# Patient Record
Sex: Male | Born: 1990 | Race: Black or African American | Hispanic: No | Marital: Married | Smoking: Never smoker
Health system: Southern US, Community
[De-identification: ages and names within clinical notes are randomized; demographics above are authoritative.]

---

## 2019-12-07 DIAGNOSIS — A084 Viral intestinal infection, unspecified: Secondary | ICD-10-CM | POA: Diagnosis not present

## 2019-12-11 DIAGNOSIS — A0839 Other viral enteritis: Secondary | ICD-10-CM | POA: Diagnosis not present

## 2019-12-13 DIAGNOSIS — A0839 Other viral enteritis: Secondary | ICD-10-CM | POA: Diagnosis not present

## 2020-06-24 DIAGNOSIS — H5789 Other specified disorders of eye and adnexa: Secondary | ICD-10-CM | POA: Diagnosis not present

## 2020-06-24 DIAGNOSIS — S0591XA Unspecified injury of right eye and orbit, initial encounter: Secondary | ICD-10-CM | POA: Diagnosis not present

## 2020-08-18 DIAGNOSIS — Z20822 Contact with and (suspected) exposure to covid-19: Secondary | ICD-10-CM | POA: Diagnosis not present

## 2021-10-02 NOTE — Progress Notes (Signed)
Subjective:    Spencer Wood - 30 y.o. male MRN [id number]  Date of birth: 1991/06/30  HPI  Spencer Wood is to establish care and annual physical exam.    Current issues and/or concerns: CHEST TIGHTNESS: Patient reports intermittent chest tightness with shortness of breath for the past 5 years. Denies chest pain, cough, coughing up blood, and additional red flag symptoms. Works in Adult nurse at [employer]. Sometimes job requires him to be in environment of porous products (wearing a respirator at that time). Not sure if symptoms are coming from work environment or history of allergies. Has not been seen by employee health or provider in the past for these concerns. Used Albuterol inhaler in the past and was helpful.  2. NASAL CONCERN: Reports left nasal passage intermittently stuffy and then pops open. History of allergies since childhood and taking over-the-counter allergy medications as needed. Has improved diet to hopefully decrease mucus production.    ROS per HPI    Health Maintenance:  Health Maintenance Due  Topic Date Due   Hepatitis C Screening  Never done     Past Medical History: There are no problems to display for this patient.   Social History   reports that he has never smoked. He has never used smokeless tobacco. He reports current alcohol use of about 1.0 standard drink per week. He reports that he does not use drugs.   Family History  family history includes Hypertension in his father.   Medications: reviewed and updated   Objective:   Physical Exam BP 107/75 (BP Location: Left Arm, Patient Position: Sitting, Cuff Size: Normal)   Pulse 63   Temp 98.6 F (37 C)   Resp 18   Ht 5' 1.18" (1.554 m)   Wt 148 lb 9.6 oz (67.4 kg)   SpO2 98%   BMI 27.91 kg/m   Physical Exam HENT:     Head: Normocephalic and atraumatic.     Right Ear: Tympanic membrane, ear canal and external ear normal.     Left Ear: Tympanic membrane, ear canal and external ear  normal.     Nose: Nose normal.     Left Turbinates: Swollen.     Mouth/Throat:     Mouth: Mucous membranes are moist.     Pharynx: Oropharynx is clear.  Eyes:     Extraocular Movements: Extraocular movements intact.     Conjunctiva/sclera: Conjunctivae normal.     Pupils: Pupils are equal, round, and reactive to light.  Cardiovascular:     Rate and Rhythm: Normal rate and regular rhythm.     Pulses: Normal pulses.     Heart sounds: Normal heart sounds.  Pulmonary:     Effort: Pulmonary effort is normal.     Breath sounds: Normal breath sounds.  Abdominal:     General: Bowel sounds are normal.     Palpations: Abdomen is soft.  Genitourinary:    Comments: Patient declined exam. Musculoskeletal:        General: Normal range of motion.     Cervical back: Normal range of motion and neck supple.  Skin:    General: Skin is warm and dry.     Capillary Refill: Capillary refill takes less than 2 seconds.  Neurological:     General: No focal deficit present.     Mental Status: He is alert and oriented to person, place, and time.  Psychiatric:        Mood and Affect: Mood normal.  Behavior: Behavior normal.       Assessment & Plan:  1. Encounter to establish care: 2. Annual physical exam: - Counseled on 150 minutes of exercise per week as tolerated, healthy eating (including decreased daily intake of saturated fats, cholesterol, added sugars, sodium), STI prevention, and routine healthcare maintenance.  3. Screening for metabolic disorder: - HOZ22+QMGN to check kidney function, liver function, and electrolyte balance.  - CMP14+EGFR  4. Screening for deficiency anemia: - CBC to screen for anemia. - CBC  5. Diabetes mellitus screening: - Hemoglobin A1c to screen for pre-diabetes/diabetes. - Hemoglobin A1c  6. Screening cholesterol level: - Lipid panel to screen for high cholesterol.  - Lipid panel  7. Thyroid disorder screen: - TSH to check thyroid function.  -  TSH  8. Need for hepatitis C screening test: - Hepatitis C antibody to screen for hepatitis C.  - Hepatitis C Antibody  9. Encounter for screening for HIV: - HIV antibody to screen for human immunodeficiency virus.  - HIV antibody (with reflex)  10. Chest tightness: 11. Shortness of breath: - Patient today in office without cardiopulmonary distress.  - Diagnostic chest x-ray for further evaluation. - Albuterol as prescribed.  - Referral to Pulmonology for further evaluation and management.  - Follow-up with primary provider as scheduled. - Ambulatory referral to Pulmonology - DG Chest 2 View; Future - albuterol (VENTOLIN HFA) 108 (90 Base) MCG/ACT inhaler; Inhale 2 puffs into the lungs every 6 (six) hours as needed for wheezing or shortness of breath.  Dispense: 18 g; Refill: 0  12. Perennial allergic rhinitis: - No evidence of deviated nasal septum. Symptomology sounds likely related to history of allergies.  - Referral to Allergy for further evaluation and management. - Follow-up with primary provider as needed.  - Ambulatory referral to Allergy  13. Screening for STD (sexually transmitted disease): - Urine cytology to screen for gonorrhea, chlamydia, and trichomonas.  - Urine cytology ancillary only    Patient was given clear instructions to go to Emergency Department or return to medical center if symptoms don't improve, worsen, or new problems develop.The patient verbalized understanding.  I discussed the assessment and treatment plan with the patient. The patient was provided an opportunity to ask questions and all were answered. The patient agreed with the plan and demonstrated an understanding of the instructions.   The patient was advised to call back or seek an in-person evaluation if the symptoms worsen or if the condition fails to improve as anticipated.    Durene Fruits, NP 10/05/2021, 8:58 AM Primary Care at [facility]

## 2021-10-05 ENCOUNTER — Encounter: Payer: Self-pay | Admitting: Family

## 2021-10-05 ENCOUNTER — Other Ambulatory Visit (HOSPITAL_COMMUNITY)
Admission: RE | Admit: 2021-10-05 | Discharge: 2021-10-05 | Disposition: A | Discharge status: Home / Self Care | Payer: BC Managed Care – PPO | Source: Ambulatory Visit | Attending: Family | Admitting: Family

## 2021-10-05 ENCOUNTER — Ambulatory Visit (INDEPENDENT_AMBULATORY_CARE_PROVIDER_SITE_OTHER): Payer: BC Managed Care – PPO | Admitting: Family

## 2021-10-05 ENCOUNTER — Encounter (INDEPENDENT_AMBULATORY_CARE_PROVIDER_SITE_OTHER): Payer: Self-pay

## 2021-10-05 ENCOUNTER — Ambulatory Visit (INDEPENDENT_AMBULATORY_CARE_PROVIDER_SITE_OTHER): Payer: BC Managed Care – PPO

## 2021-10-05 ENCOUNTER — Other Ambulatory Visit: Payer: Self-pay

## 2021-10-05 VITALS — BP 107/75 | HR 63 | Temp 98.6°F | Resp 18 | Ht 61.18 in | Wt 148.6 lb

## 2021-10-05 DIAGNOSIS — Z113 Encounter for screening for infections with a predominantly sexual mode of transmission: Secondary | ICD-10-CM | POA: Insufficient documentation

## 2021-10-05 DIAGNOSIS — R0789 Other chest pain: Secondary | ICD-10-CM | POA: Diagnosis not present

## 2021-10-05 DIAGNOSIS — Z7689 Persons encountering health services in other specified circumstances: Secondary | ICD-10-CM

## 2021-10-05 DIAGNOSIS — Z1329 Encounter for screening for other suspected endocrine disorder: Secondary | ICD-10-CM

## 2021-10-05 DIAGNOSIS — Z13228 Encounter for screening for other metabolic disorders: Secondary | ICD-10-CM | POA: Diagnosis not present

## 2021-10-05 DIAGNOSIS — Z0000 Encounter for general adult medical examination without abnormal findings: Secondary | ICD-10-CM

## 2021-10-05 DIAGNOSIS — Z131 Encounter for screening for diabetes mellitus: Secondary | ICD-10-CM | POA: Diagnosis not present

## 2021-10-05 DIAGNOSIS — R0602 Shortness of breath: Secondary | ICD-10-CM | POA: Diagnosis not present

## 2021-10-05 DIAGNOSIS — Z0001 Encounter for general adult medical examination with abnormal findings: Secondary | ICD-10-CM | POA: Diagnosis not present

## 2021-10-05 DIAGNOSIS — Z1159 Encounter for screening for other viral diseases: Secondary | ICD-10-CM

## 2021-10-05 DIAGNOSIS — J3089 Other allergic rhinitis: Secondary | ICD-10-CM | POA: Diagnosis not present

## 2021-10-05 DIAGNOSIS — Z13220 Encounter for screening for lipoid disorders: Secondary | ICD-10-CM

## 2021-10-05 DIAGNOSIS — Z114 Encounter for screening for human immunodeficiency virus [HIV]: Secondary | ICD-10-CM | POA: Diagnosis not present

## 2021-10-05 DIAGNOSIS — Z130 Encounter for screening for diseases of the blood and blood-forming organs and certain disorders involving the immune mechanism: Secondary | ICD-10-CM | POA: Diagnosis not present

## 2021-10-05 MED ORDER — ALBUTEROL SULFATE HFA 108 (90 BASE) MCG/ACT IN AERS
2.0000 | INHALATION_SPRAY | Freq: Four times a day (QID) | RESPIRATORY_TRACT | 0 refills | Status: DC | PRN
Start: 2021-10-05 — End: 2021-12-07

## 2021-10-05 NOTE — Patient Instructions (Signed)
Preventive Care 21-29 Years Old, Male ?Preventive care refers to lifestyle choices and visits with your health care provider that can promote health and wellness. Preventive care visits are also called wellness exams. ?What can I expect for my preventive care visit? ?Counseling ?During your preventive care visit, your health care provider may ask about your: ?Medical history, including: ?Past medical problems. ?Family medical history. ?Current health, including: ?Emotional well-being. ?Home life and relationship well-being. ?Sexual activity. ?Lifestyle, including: ?Alcohol, nicotine or tobacco, and drug use. ?Access to firearms. ?Diet, exercise, and sleep habits. ?Safety issues such as seatbelt and bike helmet use. ?Sunscreen use. ?Work and work environment. ?Physical exam ?Your health care provider may check your: ?Height and weight. These may be used to calculate your BMI (body mass index). BMI is a measurement that tells if you are at a healthy weight. ?Waist circumference. This measures the distance around your waistline. This measurement also tells if you are at a healthy weight and may help predict your risk of certain diseases, such as type 2 diabetes and high blood pressure. ?Heart rate and blood pressure. ?Body temperature. ?Skin for abnormal spots. ?What immunizations do I need? ?Vaccines are usually given at various ages, according to a schedule. Your health care provider will recommend vaccines for you based on your age, medical history, and lifestyle or other factors, such as travel or where you work. ?What tests do I need? ?Screening ?Your health care provider may recommend screening tests for certain conditions. This may include: ?Lipid and cholesterol levels. ?Diabetes screening. This is done by checking your blood sugar (glucose) after you have not eaten for a while (fasting). ?Hepatitis B test. ?Hepatitis C test. ?HIV (human immunodeficiency virus) test. ?STI (sexually transmitted infection)  testing, if you are at risk. ?Talk with your health care provider about your test results, treatment options, and if necessary, the need for more tests. ?Follow these instructions at home: ?Eating and drinking ? ?Eat a healthy diet that includes fresh fruits and vegetables, whole grains, lean protein, and low-fat dairy products. ?Drink enough fluid to keep your urine pale yellow. ?Take vitamin and mineral supplements as recommended by your health care provider. ?Do not drink alcohol if your health care provider tells you not to drink. ?If you drink alcohol: ?Limit how much you have to 0-2 drinks a day. ?Know how much alcohol is in your drink. In the [place] one drink equals one 12 oz bottle of beer (355 mL), one 5 oz glass of wine (148 mL), or one 1? oz glass of hard liquor (44 mL). ?Lifestyle ?Brush your teeth every morning and night with fluoride toothpaste. Floss one time each day. ?Exercise for at least 30 minutes 5 or more days each week. ?Do not use any products that contain nicotine or tobacco. These products include cigarettes, chewing tobacco, and vaping devices, such as e-cigarettes. If you need help quitting, ask your health care provider. ?Do not use drugs. ?If you are sexually active, practice safe sex. Use a condom or other form of protection to prevent STIs. ?Find healthy ways to manage stress, such as: ?Meditation, yoga, or listening to music. ?Journaling. ?Talking to a trusted person. ?Spending time with friends and family. ?Minimize exposure to UV radiation to reduce your risk of skin cancer. ?Safety ?Always wear your seat belt while driving or riding in a vehicle. ?Do not drive: ?If you have been drinking alcohol. Do not ride with someone who has been drinking. ?If you have been using any mind-altering substances or   drugs. ?While texting. ?When you are tired or distracted. ?Wear a helmet and other protective equipment during sports activities. ?If you have firearms in your house, make sure you  follow all gun safety procedures. ?Seek help if you have been physically or sexually abused. ?What's next? ?Go to your health care provider once a year for an annual wellness visit. ?Ask your health care provider how often you should have your eyes and teeth checked. ?Stay up to date on all vaccines. ?This information is not intended to replace advice given to you by your health care provider. Make sure you discuss any questions you have with your health care provider. ?Document Revised: 04/29/2021 Document Reviewed: 04/29/2021 ?Elsevier Patient Education ? 2022 Elsevier Inc. ? ?

## 2021-10-05 NOTE — Progress Notes (Signed)
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

## 2021-10-05 NOTE — Progress Notes (Signed)
Heart normal size. Lungs clear.

## 2021-10-06 LAB — LIPID PANEL
Chol/HDL Ratio: 3.2 ratio (ref 0.0–5.0)
Cholesterol, Total: 247 mg/dL — ABNORMAL HIGH (ref 100–199)
HDL: 77 mg/dL (ref >39)
LDL Chol Calc (NIH): 163 mg/dL — ABNORMAL HIGH (ref 0–99)
Triglycerides: 47 mg/dL (ref 0–149)
VLDL Cholesterol Cal: 7 mg/dL (ref 5–40)

## 2021-10-06 LAB — CMP14+EGFR
ALT: 36 IU/L (ref 0–44)
AST: 28 IU/L (ref 0–40)
Albumin/Globulin Ratio: 1.8 (ref 1.2–2.2)
Albumin: 5 g/dL (ref 4.1–5.2)
Alkaline Phosphatase: 51 IU/L (ref 44–121)
BUN/Creatinine Ratio: 14 (ref 9–20)
BUN: 14 mg/dL (ref 6–20)
Bilirubin Total: 0.7 mg/dL (ref 0.0–1.2)
CO2: 26 mmol/L (ref 20–29)
Calcium: 9.5 mg/dL (ref 8.7–10.2)
Chloride: 101 mmol/L (ref 96–106)
Creatinine, Ser: 0.97 mg/dL (ref 0.76–1.27)
Globulin, Total: 2.8 g/dL (ref 1.5–4.5)
Glucose: 83 mg/dL (ref 70–99)
Potassium: 4.3 mmol/L (ref 3.5–5.2)
Sodium: 141 mmol/L (ref 134–144)
Total Protein: 7.8 g/dL (ref 6.0–8.5)
eGFR: 108 mL/min/{1.73_m2} (ref >59)

## 2021-10-06 LAB — HEMOGLOBIN A1C
Est. average glucose Bld gHb Est-mCnc: 88 mg/dL
Hgb A1c MFr Bld: 4.7 % — ABNORMAL LOW (ref 4.8–5.6)

## 2021-10-06 LAB — CBC
Hematocrit: 45.2 % (ref 37.5–51.0)
Hemoglobin: 15.2 g/dL (ref 13.0–17.7)
MCH: 31.3 pg (ref 26.6–33.0)
MCHC: 33.6 g/dL (ref 31.5–35.7)
MCV: 93 fL (ref 79–97)
Platelets: 308 10*3/uL (ref 150–450)
RBC: 4.85 x10E6/uL (ref 4.14–5.80)
RDW: 11.1 % — ABNORMAL LOW (ref 11.6–15.4)
WBC: 2.7 10*3/uL — ABNORMAL LOW (ref 3.4–10.8)

## 2021-10-06 LAB — URINE CYTOLOGY ANCILLARY ONLY
Chlamydia: NEGATIVE
Comment: NEGATIVE
Comment: NEGATIVE
Comment: NORMAL
Neisseria Gonorrhea: NEGATIVE
Trichomonas: NEGATIVE

## 2021-10-06 LAB — HEPATITIS C ANTIBODY: Hep C Virus Ab: 0.2 s/co ratio (ref 0.0–0.9)

## 2021-10-06 LAB — TSH: TSH: 1.19 u[IU]/mL (ref 0.450–4.500)

## 2021-10-06 LAB — HIV ANTIBODY (ROUTINE TESTING W REFLEX): HIV Screen 4th Generation wRfx: NONREACTIVE

## 2021-10-06 NOTE — Progress Notes (Signed)
Gonorrhea, Chlamydia, Trichomonas negative.

## 2021-10-06 NOTE — Progress Notes (Signed)
Kidney function normal.   Liver function normal.   Thyroid function normal.   No diabetes.   Hepatitis C negative.   HIV negative.   No anemia.   White blood cells (sometimes called "infection fighters") lower than normal. Please call our office and schedule lab only appointment to have rechecked in 4 weeks.  Cholesterol higher than expected. High cholesterol may increase risk of heart attack and/or stroke. Consider eating more fruits, vegetables, and lean baked meats such as chicken or fish. Moderate intensity exercise at least 150 minutes as tolerated per week may help as well. However, your risk of heart disease is less than average risk so does not need to start a medication at the moment. Please call our office and schedule lab only appointment to have rechecked in 3 to 6 months.

## 2021-10-12 DIAGNOSIS — R509 Fever, unspecified: Secondary | ICD-10-CM | POA: Diagnosis not present

## 2021-10-12 DIAGNOSIS — J101 Influenza due to other identified influenza virus with other respiratory manifestations: Secondary | ICD-10-CM | POA: Diagnosis not present

## 2021-10-21 ENCOUNTER — Ambulatory Visit (INDEPENDENT_AMBULATORY_CARE_PROVIDER_SITE_OTHER): Payer: BC Managed Care – PPO | Admitting: Family

## 2021-10-21 ENCOUNTER — Other Ambulatory Visit: Payer: Self-pay

## 2021-10-21 DIAGNOSIS — J111 Influenza due to unidentified influenza virus with other respiratory manifestations: Secondary | ICD-10-CM | POA: Diagnosis not present

## 2021-10-21 NOTE — Progress Notes (Signed)
Virtual Visit via Telephone Note  I connected with Spencer Wood, on 10/21/2021 at 9:28 AM by telephone due to the COVID-19 pandemic and verified that I am speaking with the correct person using two identifiers.  Due to current restrictions/limitations of in-office visits due to the COVID-19 pandemic, this scheduled clinical appointment was converted to a telehealth visit.   Consent: I discussed the limitations, risks, security and privacy concerns of performing an evaluation and management service by telephone and the availability of in person appointments. I also discussed with the patient that there may be a patient responsible charge related to this service. The patient expressed understanding and agreed to proceed.   Location of Patient: Home  Location of Provider: [facility]   Persons participating in Telemedicine visit: Spencer Rennis Chris, NP Margorie John, CMA   History of Present Illness: Spencer Wood is a 30 year-old male who presents for flu follow-up.   Reports tested positive on 10/12/2021 at [facility] office. Since then has remained out of work. His employer, [employer], requesting medical release back to work. Current symptoms include cough, throat irritation, headaches, light sensitivity, and fluctuating energy. Tried course of prescribed antiviral and over-the-counter Tylenol and Theraflu without much relief.     No past medical history on file. Allergies  Allergen Reactions   Cephalexin     Current Outpatient Medications on File Prior to Visit  Medication Sig Dispense Refill   albuterol (VENTOLIN HFA) 108 (90 Base) MCG/ACT inhaler Inhale 2 puffs into the lungs every 6 (six) hours as needed for wheezing or shortness of breath. 18 g 0   No current facility-administered medications on file prior to visit.    Observations/Objective: Alert and oriented x 3. Not in acute distress. Physical  examination not completed as this is a telemedicine visit.  Assessment and Plan: 1. Influenza: - Patient intends to come to office today for updated respiratory panel. - Work letter will be available today as well.  - Continue over-the-counter regimen. Also, may consider over-the-counter lozenges, popsicles, ice cream, or jello to soothe the throat and Tylenol for headaches. - Follow-up with primary provider as scheduled.  - COVID-19, Flu A+B and RSV   Follow Up Instructions: Follow-up with primary provider as scheduled.   Patient was given clear instructions to go to Emergency Department or return to medical center if symptoms don't improve, worsen, or new problems develop.The patient verbalized understanding.  I discussed the assessment and treatment plan with the patient. The patient was provided an opportunity to ask questions and all were answered. The patient agreed with the plan and demonstrated an understanding of the instructions.   The patient was advised to call back or seek an in-person evaluation if the symptoms worsen or if the condition fails to improve as anticipated.    I provided 10 minutes total of non-face-to-face time during this encounter.   Rema Fendt, NP  [facility] Primary Care at [facility] [place], [place] [phone] 10/21/2021, 9:28 AM

## 2021-10-21 NOTE — Progress Notes (Signed)
Pt presents for telemedicine visit for positive flu 11/28, needs to know when he can return back to work

## 2021-10-22 LAB — COVID-19, FLU A+B AND RSV
Influenza A, NAA: NOT DETECTED
Influenza B, NAA: NOT DETECTED
RSV, NAA: NOT DETECTED
SARS-CoV-2, NAA: NOT DETECTED

## 2021-10-22 NOTE — Progress Notes (Signed)
Patient to follow-up with provider when feeling well enough to return to work.

## 2021-10-22 NOTE — Progress Notes (Signed)
Covid, Influenza, and RSV negative.

## 2021-12-04 NOTE — Progress Notes (Signed)
12/07/21- 30 yoM never smoker for pulmonary evaluation  courtesy of Ricky Stabs, NP with concern of chest tightness, short of breath Medical problem list Influenza 10/12/21-Tamiflu, Perennial Allergic Rhinitis,  -Ventolin hfa Covid vax-2 Phizer Flu vax-declines -----Patient states that he has been having shortness of breath and chest tightness mostly when he is at work. Denies wheezing or coughing.  Works at a Engineer, mining. At times he wears mask over beard cover, under a hood/ bunny suit in a clean room. Mask presses down on nose and increases work of breathing. Chest feels tighter without wheeze or cough. Has a Ventolin hfa he uses occ during pollen season, without formal asthma dx. Hasn't tried it at work. Told he doesn't snore much.  He mainly wanted reassurance nothing serious going on.  CXR 10/05/21-  IMPRESSION: No active cardiopulmonary disease.  Prior to Admission medications   Medication Sig Start Date End Date Taking? Authorizing Provider  albuterol (VENTOLIN HFA) 108 (90 Base) MCG/ACT inhaler INHALE 2 PUFFS INTO THE LUNGS EVERY 6 HOURS AS NEEDED FOR WHEEZING OR SHORTNESS OF BREATH 12/07/21   Rema Fendt, NP   History reviewed. No pertinent past medical history. History reviewed. No pertinent surgical history. Family History  Problem Relation Age of Onset   Hypertension Father    Social History   Socioeconomic History   Marital status: Married    Spouse name: Not on file   Number of children: Not on file   Years of education: Not on file   Highest education level: Not on file  Occupational History   Not on file  Tobacco Use   Smoking status: Never   Smokeless tobacco: Never  Vaping Use   Vaping Use: Never used  Substance and Sexual Activity   Alcohol use: Yes    Alcohol/week: 1.0 standard drink    Types: 1 Standard drinks or equivalent per week    Comment: Periodically   Drug use: Never   Sexual activity: Not on file  Other Topics  Concern   Not on file  Social History Narrative   Not on file   Social Determinants of Health   Financial Resource Strain: Not on file  Food Insecurity: Not on file  Transportation Needs: Not on file  Physical Activity: Not on file  Stress: Not on file  Social Connections: Not on file  Intimate Partner Violence: Not on file   ROS-see HPI   + = positive Constitutional:    weight loss, night sweats, fevers, chills, fatigue, lassitude. HEENT:    headaches, difficulty swallowing, tooth/dental problems, sore throat,       sneezing, itching, ear ache, +nasal congestion, post nasal drip, snoring CV:    chest pain, orthopnea, PND, swelling in lower extremities, anasarca,                                  dizziness, palpitations Resp:   +shortness of breath with exertion or at rest.                productive cough,   non-productive cough, coughing up of blood.              change in color of mucus.  wheezing.   Skin:    rash or lesions. GI:  No-   heartburn, indigestion, abdominal pain, nausea, vomiting, diarrhea,                 change  in bowel habits, loss of appetite GU: dysuria, change in color of urine, no urgency or frequency.   flank pain. MS:   joint pain, stiffness, decreased range of motion, back pain. Neuro-     nothing unusual Psych:  change in mood or affect.  depression or anxiety.   memory loss.  OBJ- Physical Exam General- Alert, Oriented, Affect-appropriate, Distress- none acute, +muscular Skin- rash-none, lesions- none, excoriation- none Lymphadenopathy- none Head- atraumatic            Eyes- Gross vision intact, PERRLA, conjunctivae and secretions clear            Ears- Hearing, canals-normal            Nose- Clear, no-Septal dev, mucus, polyps, erosion, perforation             Throat- Mallampati II , mucosa clear , drainage- none, tonsils- atrophic, + teeth Neck- flexible , trachea midline, no stridor , thyroid nl, carotid no bruit Chest - symmetrical excursion ,  unlabored           Heart/CV- RRR , no murmur , no gallop  , no rub, nl s1 s2                           - JVD- none , edema- none, stasis changes- none, varices- none           Lung- clear to P&A, wheeze- none, cough- none , dullness-none, rub- none           Chest wall-  Abd-  Br/ Gen/ Rectal- Not done, not indicated Extrem- cyanosis- none, clubbing, none, atrophy- none, strength- nl Neuro- grossly intact to observation

## 2021-12-06 ENCOUNTER — Other Ambulatory Visit: Payer: Self-pay | Admitting: Family

## 2021-12-06 DIAGNOSIS — R0789 Other chest pain: Secondary | ICD-10-CM

## 2021-12-06 DIAGNOSIS — R0602 Shortness of breath: Secondary | ICD-10-CM

## 2021-12-07 ENCOUNTER — Encounter: Payer: Self-pay | Admitting: Internal Medicine

## 2021-12-07 ENCOUNTER — Ambulatory Visit (INDEPENDENT_AMBULATORY_CARE_PROVIDER_SITE_OTHER): Payer: BC Managed Care – PPO | Admitting: Internal Medicine

## 2021-12-07 ENCOUNTER — Other Ambulatory Visit: Payer: Self-pay

## 2021-12-07 DIAGNOSIS — J302 Other seasonal allergic rhinitis: Secondary | ICD-10-CM | POA: Diagnosis not present

## 2021-12-07 DIAGNOSIS — J3089 Other allergic rhinitis: Secondary | ICD-10-CM | POA: Diagnosis not present

## 2021-12-07 DIAGNOSIS — R0609 Other forms of dyspnea: Secondary | ICD-10-CM | POA: Diagnosis not present

## 2021-12-07 NOTE — Patient Instructions (Signed)
Try wearing your work mask outside the clean room. Move around some so you breathe a little harder. See how much difference just having that mask on makes.  Try wearing an otc  Breathe Right nasal strip under the mask.  Try using your Ventolin inhaler (2 puffs) a few minutes before you go into the clean room. See if it makes any difference in the sense of chest tightness.  Happy to see you again if problems persist.

## 2021-12-07 NOTE — Assessment & Plan Note (Signed)
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 Plan- try Breathe Right strips and adjust mask as able.

## 2021-12-07 NOTE — Assessment & Plan Note (Signed)
He may have mild intermittent asthma, with increased work of breathing through mask.  Plan- he will try wearing mask outside of clean room for comparison. Try using Ventolin hfa shortly before going into clean room. Watch circumstances to see if there are triggers. He might possibly need to consider asking for work-place accomodation, perhaps to keep out of clean room, although there is no obvious environmental problem.

## 2021-12-08 ENCOUNTER — Encounter: Payer: Self-pay | Admitting: Allergy and Immunology

## 2021-12-08 ENCOUNTER — Ambulatory Visit (INDEPENDENT_AMBULATORY_CARE_PROVIDER_SITE_OTHER): Payer: BC Managed Care – PPO | Admitting: Allergy and Immunology

## 2021-12-08 VITALS — BP 102/80 | HR 72 | Temp 98.6°F | Resp 16 | Ht 61.0 in | Wt 157.2 lb

## 2021-12-08 DIAGNOSIS — J301 Allergic rhinitis due to pollen: Secondary | ICD-10-CM

## 2021-12-08 DIAGNOSIS — J3089 Other allergic rhinitis: Secondary | ICD-10-CM

## 2021-12-08 DIAGNOSIS — J342 Deviated nasal septum: Secondary | ICD-10-CM | POA: Diagnosis not present

## 2021-12-08 MED ORDER — OLOPATADINE HCL 0.2 % OP SOLN: 1.0000 [drp] | Freq: Every day | OPHTHALMIC | 2 refills | Status: DC

## 2021-12-08 MED ORDER — MONTELUKAST SODIUM 10 MG PO TABS: 10.0000 mg | ORAL_TABLET | Freq: Every day | ORAL | 2 refills | Status: DC

## 2021-12-08 MED ORDER — AZELASTINE-FLUTICASONE 137-50 MCG/ACT NA SUSP: NASAL | 2 refills | Status: DC

## 2021-12-08 NOTE — Patient Instructions (Addendum)
°  1.  Allergen avoidance measures - pollens, mold  2.  Treat and prevent inflammation:  A. Dymista - 1-2 sprays each nostril 1-2 times per day B. Montelukast 10 mg - 1 tablet 1 time per day  3. If needed:  A. Nasal saline B. Antihistamine C. Pataday - 1 drop each eye 1 time per day  4. May need to consider repair of deviated septum   5. Can consider a course of immunotherapy  6. Return to clinic in 8 weeks or earlier if problem

## 2021-12-08 NOTE — Progress Notes (Signed)
[employer] [facility] [place]   Dear Ricky StabsAmy Stephens,  Thank you for referring Spencer Hornerazarian Fulbright [facility] [place] on 12/08/2021.   Below is a summation of this patient's evaluation and recommendations.  Thank you for your referral. I will keep you informed about this patient's response to treatment.   If you have any questions please do not hesitate to contact me.   Sincerely,  Jessica PriestEric J. Hartleigh Edmonston, MD Allergy / Immunology [facility]   ______________________________________________________________________    NEW PATIENT NOTE  Referring Provider: Rema FendtStephens, Amy J, NP Primary Provider: Rema FendtStephens, Amy J, NP Date of office visit: 12/08/2021    Subjective:   Chief Complaint:  Spencer Wood (DOB: [birth year]) is a 31 y.o. male who presents to the clinic on 12/08/2021 with a chief complaint of Allergic Rhinitis  (Says he has had allergies since he was a child.  Last tested in middle school. All environmental allergies were positive. Itchy watery eyes. Left nostril swells restricting breathing. ), Asthma (Says he used an inhaler when pollen was high. ), and Eczema (Has some rough dry patches. ) .     HPI: Spencer Cockayneaz presents to this clinic in evaluation of 2 main issues.  First, he has very significant nasal congestion that appears to be more prevalent on the left side than the right side.  Sometimes his nose gets so congested he cannot pass any air through his left side.  He does have some sneezing and some itchy eyes as well.  Provoking factors for symptoms include exposure to pollen and dust and grasses especially.  He does not have a history of headaches or recurrent sinusitis.  Sometimes if he gets real congested he does develop anosmia.  He uses Dymista and he uses Allegra and he thinks that this does help him somewhat.  He uses his Dymista mostly during the spring and fall  intermittently.  Second, he develops an issue with chest tightness and panic.  He can tell that he gets anxious.  This appears to be precipitated when he develops a very significant upper airway congestion.  He does not have a history of exercise-induced bronchospastic symptoms.  He does not have a history of cold air induced bronchospastic symptoms.  He has not received the flu vaccine but did develop influenza in December 2022 without any long-term sequela.  He has received 2 COVID vaccines.  History reviewed. No pertinent past medical history.  History reviewed. No pertinent surgical history.  Allergies as of 12/08/2021       Reactions   Cephalexin         Medication List    albuterol 108 (90 Base) MCG/ACT inhaler Commonly known as: VENTOLIN HFA INHALE 2 PUFFS INTO THE LUNGS EVERY 6 HOURS AS NEEDED FOR WHEEZING OR SHORTNESS OF BREATH   Dymista 137-50 MCG/ACT Susp Generic drug: Azelastine-Fluticasone Place 1 spray into the nose daily as needed.   fexofenadine 180 MG tablet Commonly known as: ALLEGRA Take 180 mg by mouth daily.   NONFORMULARY OR COMPOUNDED ITEM Take 1 Dose by mouth daily as needed. Raw Green Powder   OVER THE COUNTER MEDICATION Take 1 Dose by mouth daily. Eco Drink    Review of systems negative except as noted in HPI / PMHx or noted below:  Review of Systems  Constitutional: Negative.   HENT: Negative.    Eyes: Negative.   Respiratory: Negative.  Cardiovascular: Negative.   Gastrointestinal: Negative.   Genitourinary: Negative.   Musculoskeletal: Negative.   Skin: Negative.   Neurological: Negative.   Endo/Heme/Allergies: Negative.   Psychiatric/Behavioral: Negative.     Family History  Problem Relation Age of Onset   Allergic rhinitis Mother    Hypertension Father     Social History   Socioeconomic History   Marital status: Married    Spouse name: Not on file   Number of children: Not on file   Years of education: Not on file    Highest education level: Not on file  Occupational History   Not on file  Tobacco Use   Smoking status: Never   Smokeless tobacco: Never  Vaping Use   Vaping Use: Never used  Substance and Sexual Activity   Alcohol use: Yes    Alcohol/week: 1.0 standard drink    Types: 1 Standard drinks or equivalent per week    Comment: Periodically   Drug use: Never   Sexual activity: Yes  Other Topics Concern   Not on file  Social History Narrative   Not on file   Environmental and Social history  Lives in a house with a dry environment, no animals located inside the household, carpet in the bedroom, plastic on the bed, plastic on the pillow, and no smoking ongoing with inside the household.  He works as a Teacher, adult education.  Objective:   Vitals:   12/08/21 1014  BP: 102/80  Pulse: 72  Resp: 16  Temp: 98.6 F (37 C)  SpO2: 100%   Height: 5\' 1"  (154.9 cm) Weight: 157 lb 3.2 oz (71.3 kg)  Physical Exam Constitutional:      Appearance: He is not diaphoretic.  HENT:     Head: Normocephalic.     Right Ear: Tympanic membrane, ear canal and external ear normal.     Left Ear: Tympanic membrane, ear canal and external ear normal.     Nose: Septal deviation (Right to left) present. No mucosal edema or rhinorrhea.     Mouth/Throat:     Pharynx: Uvula midline. No oropharyngeal exudate.  Eyes:     Conjunctiva/sclera: Conjunctivae normal.  Neck:     Thyroid: No thyromegaly.     Trachea: Trachea normal. No tracheal tenderness or tracheal deviation.  Cardiovascular:     Rate and Rhythm: Normal rate and regular rhythm.     Heart sounds: Normal heart sounds, S1 normal and S2 normal. No murmur heard. Pulmonary:     Effort: No respiratory distress.     Breath sounds: Normal breath sounds. No stridor. No wheezing or rales.  Lymphadenopathy:     Head:     Right side of head: No tonsillar adenopathy.     Left side of head: No tonsillar adenopathy.      Cervical: No cervical adenopathy.  Skin:    Findings: No erythema or rash.     Nails: There is no clubbing.  Neurological:     Mental Status: He is alert.    Diagnostics: Allergy skin tests were performed.  He demonstrated hypersensitivity to trees, grasses, weeds, mold  Spirometry was performed and demonstrated an FEV1 of 3.41 @ 120 % of predicted. FEV1/FVC = 0.80  Assessment and Plan:    1. Perennial allergic rhinitis   2. Seasonal allergic rhinitis due to pollen   3. Deviated septum     1.  Allergen avoidance measures - pollens, mold  2.  Treat and prevent inflammation:  A.  Dymista - 1-2 sprays each nostril 1-2 times per day B. Montelukast 10 mg - 1 tablet 1 time per day  3. If needed:  A. Nasal saline B. Antihistamine C. Pataday - 1 drop each eye 1 time per day  4. May need to consider repair of deviated septum   5. Can consider a course of immunotherapy  6. Return to clinic in 8 weeks or earlier if problem  Spencer Wood has atopic disease contributing to some of his respiratory tract symptomatology and hopefully with a combination of allergen avoidance measures and the use of anti-inflammatory agents for his airway as noted above this will become less of an issue for him as he moves through each season of the year.  However, he has a very significantly deviated septum and if he still finds that he has congestive upper airway symptoms mostly on the left side then he will probably need to have that anatomical defect corrected by ENT.  He will keep in contact with me noting his response to this approach as we move forward.  I will see him back in his clinic in 8 weeks or earlier if there is a problem.   Jessica Priest, MD Allergy / Immunology [facility] [place]

## 2021-12-09 ENCOUNTER — Encounter: Payer: Self-pay | Admitting: Allergy and Immunology

## 2021-12-09 NOTE — Addendum Note (Signed)
Addended by: Felipa Emory on: 12/09/2021 05:21 PM   Modules accepted: Orders

## 2022-03-03 NOTE — Progress Notes (Signed)
? ? ?Patient ID: Spencer Wood, male    DOB: 1991/01/29  MRN: [id number] ? ?CC: Back Pain  ? ?Subjective: ?Spencer Wood is a 31 y.o. male who presents for back pain.  ? ?His concerns today include:  ?Reports right-sided low back pain began on 02/13/2022 after completing calisthenics stretching exercises. Subsequently noticed a nodule at right lower back felt when pressing without symptoms. Has not increased in size since then. Wonders if a muscle is pulled at right lower back. Notices stiffness and soreness sometimes with certain movements. Taking Tylenol and Ibuprofen to help. Denies any additional red flag symptoms.  ? ?Concern for right posterior forearm spot which appears to be a scab. Noticed since about 7 days ago. Has improved since then. Only symptom is itching. Has not been doing any outside activities which would expose him to pests.  ? ?Patient Active Problem List  ? Diagnosis Date Noted  ? Seasonal and perennial allergic rhinitis 12/07/2021  ? Dyspnea on exertion 12/07/2021  ?  ? ?Current Outpatient Medications on File Prior to Visit  ?Medication Sig Dispense Refill  ? albuterol (VENTOLIN HFA) 108 (90 Base) MCG/ACT inhaler INHALE 2 PUFFS INTO THE LUNGS EVERY 6 HOURS AS NEEDED FOR WHEEZING OR SHORTNESS OF BREATH 18 g 0  ? Azelastine-Fluticasone (DYMISTA) 137-50 MCG/ACT SUSP Place 1 spray into the nose daily as needed.    ? Azelastine-Fluticasone (DYMISTA) 137-50 MCG/ACT SUSP 1-2 sprays each nostril 1-2 times daily. 23 g 2  ? fexofenadine (ALLEGRA) 180 MG tablet Take 180 mg by mouth daily.    ? montelukast (SINGULAIR) 10 MG tablet Take 1 tablet (10 mg total) by mouth at bedtime. 30 tablet 2  ? NONFORMULARY OR COMPOUNDED ITEM Take 1 Dose by mouth daily as needed. Raw Green Powder    ? Olopatadine HCl (PATADAY) 0.2 % SOLN Place 1 drop into both eyes daily. 2.5 mL 2  ? OVER THE COUNTER MEDICATION Take 1 Dose by mouth daily. Eco Drink    ? ?No current facility-administered medications on file prior to visit.   ? ? ?Allergies  ?Allergen Reactions  ? Cephalexin   ? ? ?Social History  ? ?Socioeconomic History  ? Marital status: Married  ?  Spouse name: Not on file  ? Number of children: Not on file  ? Years of education: Not on file  ? Highest education level: Not on file  ?Occupational History  ? Not on file  ?Tobacco Use  ? Smoking status: Never  ?  Passive exposure: Never  ? Smokeless tobacco: Never  ?Vaping Use  ? Vaping Use: Never used  ?Substance and Sexual Activity  ? Alcohol use: Yes  ?  Alcohol/week: 1.0 standard drink  ?  Types: 1 Standard drinks or equivalent per week  ?  Comment: Periodically  ? Drug use: Never  ? Sexual activity: Yes  ?Other Topics Concern  ? Not on file  ?Social History Narrative  ? Not on file  ? ?Social Determinants of Health  ? ?Financial Resource Strain: Not on file  ?Food Insecurity: Not on file  ?Transportation Needs: Not on file  ?Physical Activity: Not on file  ?Stress: Not on file  ?Social Connections: Not on file  ?Intimate Partner Violence: Not on file  ? ? ?Family History  ?Problem Relation Age of Onset  ? Allergic rhinitis Mother   ? Hypertension Father   ? ? ?No past surgical history on file. ? ?ROS: ?Review of Systems ?Negative except as stated above ? ?PHYSICAL EXAM: ?  BP 115/75 (BP Location: Left Arm, Patient Position: Sitting, Cuff Size: Large)   Pulse 73   Temp 98.3 ?F (36.8 ?C)   Resp 18   Ht 5' 0.98" (1.549 m)   Wt 154 lb (69.9 kg)   SpO2 97%   BMI 29.11 kg/m?  ? ?Physical Exam ?HENT:  ?   Head: Normocephalic and atraumatic.  ?Eyes:  ?   Extraocular Movements: Extraocular movements intact.  ?   Conjunctiva/sclera: Conjunctivae normal.  ?   Pupils: Pupils are equal, round, and reactive to light.  ?Cardiovascular:  ?   Rate and Rhythm: Normal rate and regular rhythm.  ?   Pulses: Normal pulses.  ?   Heart sounds: Normal heart sounds.  ?Pulmonary:  ?   Effort: Pulmonary effort is normal.  ?   Breath sounds: Normal breath sounds.  ?Musculoskeletal:  ?   Cervical back:  Normal, normal range of motion and neck supple.  ?   Thoracic back: Normal.  ?   Comments: Right lumbar with pea-sized movable non-tender nodule on palpation.   ?Skin: ?   General: Skin is warm and dry.  ?   Comments: Firm hyperpigmented scab with mild erythema at perimeter of right posterior forearm, no evidence of drainage.   ?Neurological:  ?   General: No focal deficit present.  ?   Mental Status: He is alert and oriented to person, place, and time.  ?Psychiatric:     ?   Mood and Affect: Mood normal.     ?   Behavior: Behavior normal.  ? ?ASSESSMENT AND PLAN: ?1. Acute right-sided low back pain, unspecified whether sciatica present: ?- Patient declined pharmacological management.  ?- Continue over-the-counter Acetaminophen and Ibuprofen. ?- Diagnostic xray lumbar spine for further evaluation. ?- DG Lumbar Spine Complete; Future ? ?2. Rash and nonspecific skin eruption: ?- Try course of over-the-counter hydrocortisone.  ?- Patient will watchful monitor at home and follow-up with primary provider as scheduled. ? ? ?Patient was given the opportunity to ask questions.  Patient verbalized understanding of the plan and was able to repeat key elements of the plan. Patient was given clear instructions to go to Emergency Department or return to medical center if symptoms don't improve, worsen, or new problems develop.The patient verbalized understanding. ? ? ?Orders Placed This Encounter  ?Procedures  ? DG Lumbar Spine Complete  ? ?Follow-up with primary provider as scheduled. ? ?Rema Fendt, NP  ?

## 2022-03-08 ENCOUNTER — Encounter: Payer: Self-pay | Admitting: Family

## 2022-03-08 ENCOUNTER — Ambulatory Visit (INDEPENDENT_AMBULATORY_CARE_PROVIDER_SITE_OTHER): Payer: BC Managed Care – PPO

## 2022-03-08 ENCOUNTER — Ambulatory Visit (INDEPENDENT_AMBULATORY_CARE_PROVIDER_SITE_OTHER): Payer: BC Managed Care – PPO | Admitting: Family

## 2022-03-08 VITALS — BP 115/75 | HR 73 | Temp 98.3°F | Resp 18 | Ht 60.98 in | Wt 154.0 lb

## 2022-03-08 DIAGNOSIS — M545 Low back pain, unspecified: Secondary | ICD-10-CM | POA: Diagnosis not present

## 2022-03-08 DIAGNOSIS — R21 Rash and other nonspecific skin eruption: Secondary | ICD-10-CM | POA: Diagnosis not present

## 2022-03-08 DIAGNOSIS — M5137 Other intervertebral disc degeneration, lumbosacral region: Secondary | ICD-10-CM | POA: Diagnosis not present

## 2022-03-08 NOTE — Progress Notes (Signed)
Pt presents for low back pain, states that back on 4/1 he pulled his back muscle and pt states seems pain has flared back up  ?Also has mole on right forearm that is causing irritation  ?

## 2022-03-09 ENCOUNTER — Other Ambulatory Visit: Payer: Self-pay | Admitting: Family

## 2022-03-09 ENCOUNTER — Ambulatory Visit: Payer: BC Managed Care – PPO | Admitting: Allergy and Immunology

## 2022-03-09 DIAGNOSIS — M5137 Other intervertebral disc degeneration, lumbosacral region: Secondary | ICD-10-CM

## 2022-03-09 DIAGNOSIS — M419 Scoliosis, unspecified: Secondary | ICD-10-CM

## 2022-03-09 NOTE — Progress Notes (Signed)
Referral to Orthopedics for scoliosis and arthritis of back. Their office should call within 2 weeks with appointment details.

## 2022-04-06 ENCOUNTER — Ambulatory Visit (INDEPENDENT_AMBULATORY_CARE_PROVIDER_SITE_OTHER): Payer: BC Managed Care – PPO | Admitting: Orthopaedic Surgery

## 2022-04-06 ENCOUNTER — Encounter: Payer: Self-pay | Admitting: Orthopaedic Surgery

## 2022-04-06 DIAGNOSIS — M545 Low back pain, unspecified: Secondary | ICD-10-CM

## 2022-04-06 DIAGNOSIS — M5459 Other low back pain: Secondary | ICD-10-CM

## 2022-04-07 DIAGNOSIS — M545 Low back pain, unspecified: Secondary | ICD-10-CM | POA: Insufficient documentation

## 2022-04-07 NOTE — Progress Notes (Signed)
Office Visit Note   Patient: Spencer Wood           Date of Birth: [birth year]           MRN: [id number] Visit Date: 04/06/2022              Requested by: Camillia Herter, NP [address],  [place] [id number] PCP: Camillia Herter, NP   Assessment & Plan: Visit Diagnoses:  1. Low back pain without sciatica, unspecified back pain laterality, unspecified chronicity     Plan: Discussed with him avoiding military presses as well as squats with weights.  No radiculopathy at present.  He will follow-up if he has progressive symptoms.  Pathophysiology discussed and x-rays reviewed.  Follow-Up Instructions: No follow-ups on file.   Orders:  No orders of the defined types were placed in this encounter.  No orders of the defined types were placed in this encounter.     Procedures: No procedures performed   Clinical Data: No additional findings.   Subjective: Chief Complaint  Patient presents with   Lower Back - Pain    HPI 31 year old male works full-time want to work out with [employer] has had some discomfort in his back since April when he was getting up from the floor and twisted his back.  He has palpated where he is having discomfort and noticed the nipple over the sacroiliac joint.  He is taken ibuprofen and Tylenol in the past when he got discomfort but recently this has not been effective.  No bowel or bladder symptoms no chills or fever.  Review of Systems patient occasionally has some dyspnea on exertion with seasonal allergies.  Otherwise noncontributory to HPI.   Objective: Vital Signs: BP 130/82   Pulse 67   Ht 5\' 1"  (1.549 m)   Wt 150 lb (68 kg)   BMI 28.34 kg/m   Physical Exam Constitutional:      Appearance: He is well-developed.  HENT:     Head: Normocephalic and atraumatic.     Right Ear: External ear normal.     Left Ear: External ear normal.  Eyes:     Pupils: Pupils are equal, round, and reactive to light.  Neck:      Thyroid: No thyromegaly.     Trachea: No tracheal deviation.  Cardiovascular:     Rate and Rhythm: Normal rate.  Pulmonary:     Effort: Pulmonary effort is normal.     Breath sounds: No wheezing.  Abdominal:     General: Bowel sounds are normal.     Palpations: Abdomen is soft.  Musculoskeletal:     Cervical back: Neck supple.  Skin:    General: Skin is warm and dry.     Capillary Refill: Capillary refill takes less than 2 seconds.  Neurological:     Mental Status: He is alert and oriented to person, place, and time.  Psychiatric:        Behavior: Behavior normal.        Thought Content: Thought content normal.        Judgment: Judgment normal.    Ortho Exam patient has intact reflexes.  Normal hip range of motion no sciatic notch tenderness no trochanteric bursal tenderness.  Patient is able to heel to heel and toe walk.  Normal strength lower extremities good muscle development.  Good flexibility lumbar spine.  Specialty Comments:  No specialty comments available.  Imaging:EXAM: LUMBAR SPINE - COMPLETE 4+ VIEW   COMPARISON:  No recent prior.   FINDINGS: Lumbar spine numbered with the lowest segmented appearing lumbar shaped vertebrae on lateral view as L5. Mild thoracolumbar spine scoliosis concave left. Mild disc degeneration L5-S1. No acute or focal bony abnormality identified.   IMPRESSION: Mild thoracolumbar spine scoliosis concave left. Mild L5-S1 degenerative disc disease. No acute or focal bony abnormality identified.     Electronically Signed   By: Marcello Moores  Register M.D.   On: 03/09/2022 12:38      PMFS History: Patient Active Problem List   Diagnosis Date Noted   Low back pain 04/07/2022   Seasonal and perennial allergic rhinitis 12/07/2021   Dyspnea on exertion 12/07/2021   No past medical history on file.  Family History  Problem Relation Age of Onset   Allergic rhinitis Mother    Hypertension Father     No past surgical history on  file. Social History   Occupational History   Not on file  Tobacco Use   Smoking status: Never    Passive exposure: Never   Smokeless tobacco: Never  Vaping Use   Vaping Use: Never used  Substance and Sexual Activity   Alcohol use: Yes    Alcohol/week: 1.0 standard drink    Types: 1 Standard drinks or equivalent per week    Comment: Periodically   Drug use: Never   Sexual activity: Yes

## 2022-10-26 ENCOUNTER — Encounter: Payer: BC Managed Care – PPO | Admitting: Family

## 2022-10-26 NOTE — Progress Notes (Signed)
Erroneous encounter-disregard

## 2022-11-01 NOTE — Progress Notes (Unsigned)
Virtual Visit via Telephone Note  I connected with Spencer Wood, on 11/03/2022 at 11:15 AM by telephone and verified that I am speaking with the correct person using two identifiers.  Consent: I discussed the limitations, risks, security and privacy concerns of performing an evaluation and management service by telephone and the availability of in person appointments. I also discussed with the patient that there may be a patient responsible charge related to this service. The patient expressed understanding and agreed to proceed.   Location of Patient: Home  Location of Provider: [facility]   Persons participating in Telemedicine visit: Spencer Rennis Chris, NP Spencer Wood, CMA   History of Present Illness: Spencer Wood is a 31 y.o. male who presents for [facility] paperwork completion. Requests the same to be completed so that he may be able to accompany his wife to doctor's appointments and assist with any episodes related to depression and PTSD. 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 No further issues/concerns today.     No past medical history on file. Allergies  Allergen Reactions   Cephalexin     Current Outpatient Medications on File Prior to Visit  Medication Sig Dispense Refill   albuterol (VENTOLIN HFA) 108 (90 Base) MCG/ACT inhaler INHALE 2 PUFFS INTO THE LUNGS EVERY 6 HOURS AS NEEDED FOR WHEEZING OR SHORTNESS OF BREATH 18 g 0   Azelastine-Fluticasone (DYMISTA) 137-50 MCG/ACT SUSP 1-2 sprays each nostril 1-2 times daily. 23 g 2   Azelastine-Fluticasone 137-50 MCG/ACT SUSP Place 1 spray into the nose daily as needed.     fexofenadine (ALLEGRA) 180 MG tablet Take 180 mg by mouth daily.     montelukast (SINGULAIR) 10 MG tablet Take 1 tablet (10 mg total) by mouth at bedtime. 30 tablet 2   NONFORMULARY OR COMPOUNDED ITEM Take 1 Dose by mouth daily as needed. Raw Green Powder     Olopatadine  HCl (PATADAY) 0.2 % SOLN Place 1 drop into both eyes daily. 2.5 mL 2   OVER THE COUNTER MEDICATION Take 1 Dose by mouth daily. Eco Drink     No current facility-administered medications on file prior to visit.    Observations/Objective: Alert and oriented x 3. Not in acute distress. Physical examination not completed as this is a telemedicine visit.  Assessment and Plan: 1. Encounter for completion of form with patient 2. Depression, unspecified depression type 3. PTSD (post-traumatic stress disorder) - Patient requests FMLA paperwork to be completed so that he may be able to accompany his wife to doctor's appointments and assist with any episodes related to her depression and PTSD.  - FMLA paperwork completed today in office.    Follow Up Instructions: Follow-up with primary provider as scheduled.   Patient was given clear instructions to go to Emergency Department or return to medical center if symptoms don't improve, worsen, or new problems develop.The patient verbalized understanding.  I discussed the assessment and treatment plan with the patient. The patient was provided an opportunity to ask questions and all were answered. The patient agreed with the plan and demonstrated an understanding of the instructions.   The patient was advised to call back or seek an in-person evaluation if the symptoms worsen or if the condition fails to improve as anticipated.    I provided 10 minutes total of non-face-to-face time during this encounter.   Jessie Cowher Jodi Geralds, NP  [facility], [place]  [phone] 11/03/2022, 11:15 AM

## 2022-11-03 ENCOUNTER — Ambulatory Visit (INDEPENDENT_AMBULATORY_CARE_PROVIDER_SITE_OTHER): Payer: BC Managed Care – PPO | Admitting: Family

## 2022-11-03 DIAGNOSIS — Z0289 Encounter for other administrative examinations: Secondary | ICD-10-CM

## 2022-11-03 DIAGNOSIS — F4310 Post-traumatic stress disorder, unspecified: Secondary | ICD-10-CM

## 2022-11-03 DIAGNOSIS — F32A Depression, unspecified: Secondary | ICD-10-CM

## 2022-11-18 ENCOUNTER — Encounter: Payer: Self-pay | Admitting: Family Medicine

## 2022-11-18 ENCOUNTER — Ambulatory Visit (INDEPENDENT_AMBULATORY_CARE_PROVIDER_SITE_OTHER): Payer: BC Managed Care – PPO | Admitting: Family Medicine

## 2022-11-18 VITALS — BP 119/84 | HR 68 | Temp 97.5°F | Resp 16 | Wt 150.0 lb

## 2022-11-18 DIAGNOSIS — B349 Viral infection, unspecified: Secondary | ICD-10-CM

## 2022-11-18 MED ORDER — AZITHROMYCIN 250 MG PO TABS: ORAL_TABLET | ORAL | 0 refills | Status: AC

## 2022-11-18 NOTE — Progress Notes (Signed)
Muscle, chills, fever, headache x 4 days. Patient says fever has broken but still fill bad with nasal congestion,dry mouth, and little fatigue.

## 2022-11-19 LAB — COVID-19, FLU A+B AND RSV
Influenza A, NAA: NOT DETECTED
Influenza B, NAA: DETECTED — AB
RSV, NAA: NOT DETECTED
SARS-CoV-2, NAA: NOT DETECTED

## 2022-11-22 NOTE — Progress Notes (Signed)
Established Patient Office Visit  Subjective    Patient ID: Spencer Wood, male    DOB: Feb 09, 1991  Age: 32 y.o. MRN: [id number]  CC: No chief complaint on file.   HPI [place] presents with complaint of fever/chills, headache, body aches and productive cough for 3-4 days. Denies known contacts or exposures.    Outpatient Encounter Medications as of 11/18/2022  Medication Sig   azithromycin (ZITHROMAX) 250 MG tablet Take 2 tablets on day 1, then 1 tablet daily on days 2 through 5   albuterol (VENTOLIN HFA) 108 (90 Base) MCG/ACT inhaler INHALE 2 PUFFS INTO THE LUNGS EVERY 6 HOURS AS NEEDED FOR WHEEZING OR SHORTNESS OF BREATH   Azelastine-Fluticasone (DYMISTA) 137-50 MCG/ACT SUSP 1-2 sprays each nostril 1-2 times daily.   Azelastine-Fluticasone 137-50 MCG/ACT SUSP Place 1 spray into the nose daily as needed.   fexofenadine (ALLEGRA) 180 MG tablet Take 180 mg by mouth daily.   montelukast (SINGULAIR) 10 MG tablet Take 1 tablet (10 mg total) by mouth at bedtime.   NONFORMULARY OR COMPOUNDED ITEM Take 1 Dose by mouth daily as needed. Raw Green Powder   Olopatadine HCl (PATADAY) 0.2 % SOLN Place 1 drop into both eyes daily.   OVER THE COUNTER MEDICATION Take 1 Dose by mouth daily. Eco Drink   No facility-administered encounter medications on file as of 11/18/2022.    History reviewed. No pertinent past medical history.  History reviewed. No pertinent surgical history.  Family History  Problem Relation Age of Onset   Allergic rhinitis Mother    Hypertension Father     Social History   Socioeconomic History   Marital status: Married    Spouse name: Not on file   Number of children: Not on file   Years of education: Not on file   Highest education level: Not on file  Occupational History   Not on file  Tobacco Use   Smoking status: Never    Passive exposure: Never   Smokeless tobacco: Never  Vaping Use   Vaping Use: Never used  Substance and Sexual Activity    Alcohol use: Yes    Alcohol/week: 1.0 standard drink of alcohol    Types: 1 Standard drinks or equivalent per week    Comment: Periodically   Drug use: Never   Sexual activity: Yes  Other Topics Concern   Not on file  Social History Narrative   Not on file   Social Determinants of Health   Financial Resource Strain: Not on file  Food Insecurity: Not on file  Transportation Needs: Not on file  Physical Activity: Not on file  Stress: Not on file  Social Connections: Not on file  Intimate Partner Violence: Not on file    Review of Systems  Constitutional:  Positive for chills, fever and malaise/fatigue.  Respiratory:  Positive for cough.   Neurological:  Positive for headaches.  All other systems reviewed and are negative.       Objective    BP 119/84   Pulse 68   Temp (!) 97.5 F (36.4 C) (Oral)   Resp 16   Wt 150 lb (68 kg)   SpO2 96%   BMI 28.34 kg/m   Physical Exam Vitals and nursing note reviewed.  Constitutional:      General: He is not in acute distress. Cardiovascular:     Rate and Rhythm: Normal rate and regular rhythm.  Pulmonary:     Effort: Pulmonary effort is normal.     Breath  sounds: Normal breath sounds.  Abdominal:     Palpations: Abdomen is soft.     Tenderness: There is no abdominal tenderness.  Neurological:     General: No focal deficit present.     Mental Status: He is alert and oriented to person, place, and time.         Assessment & Plan:   1. Viral syndrome Viral swab pending. Zithromax prescribed. Conservative management including adequate fluids and rest and tylenol/nsaids. Recommended.  - COVID-19, Flu A+B and RSV    Return if symptoms worsen or fail to improve.   Becky Sax, MD

## 2023-01-29 IMAGING — DX DG CHEST 2V
2 series · 2 of 2 positions shown · non-contrast
Comparison: None.

CLINICAL DATA: shortness of breath and chest tightness.

EXAM:
CHEST - 2 VIEW

[chest pa]
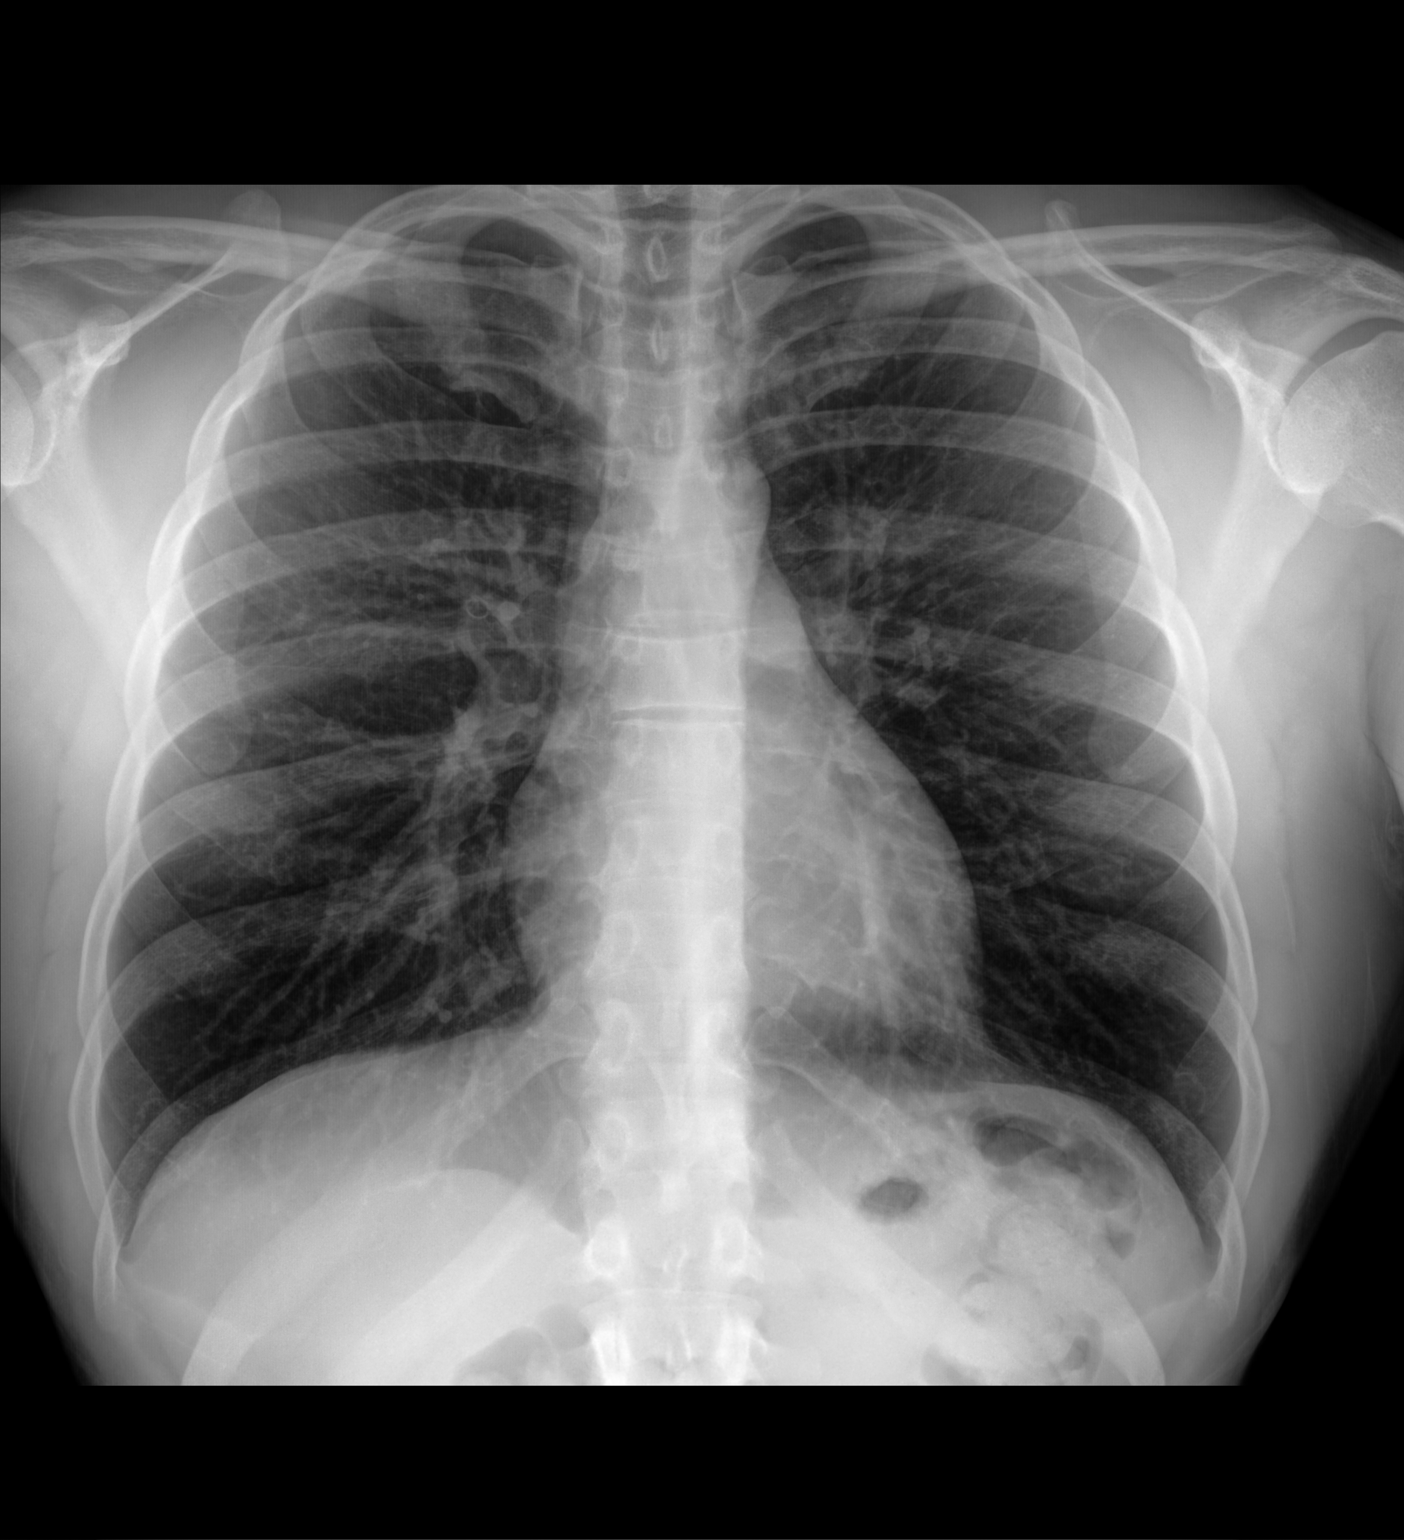

[chest lat]
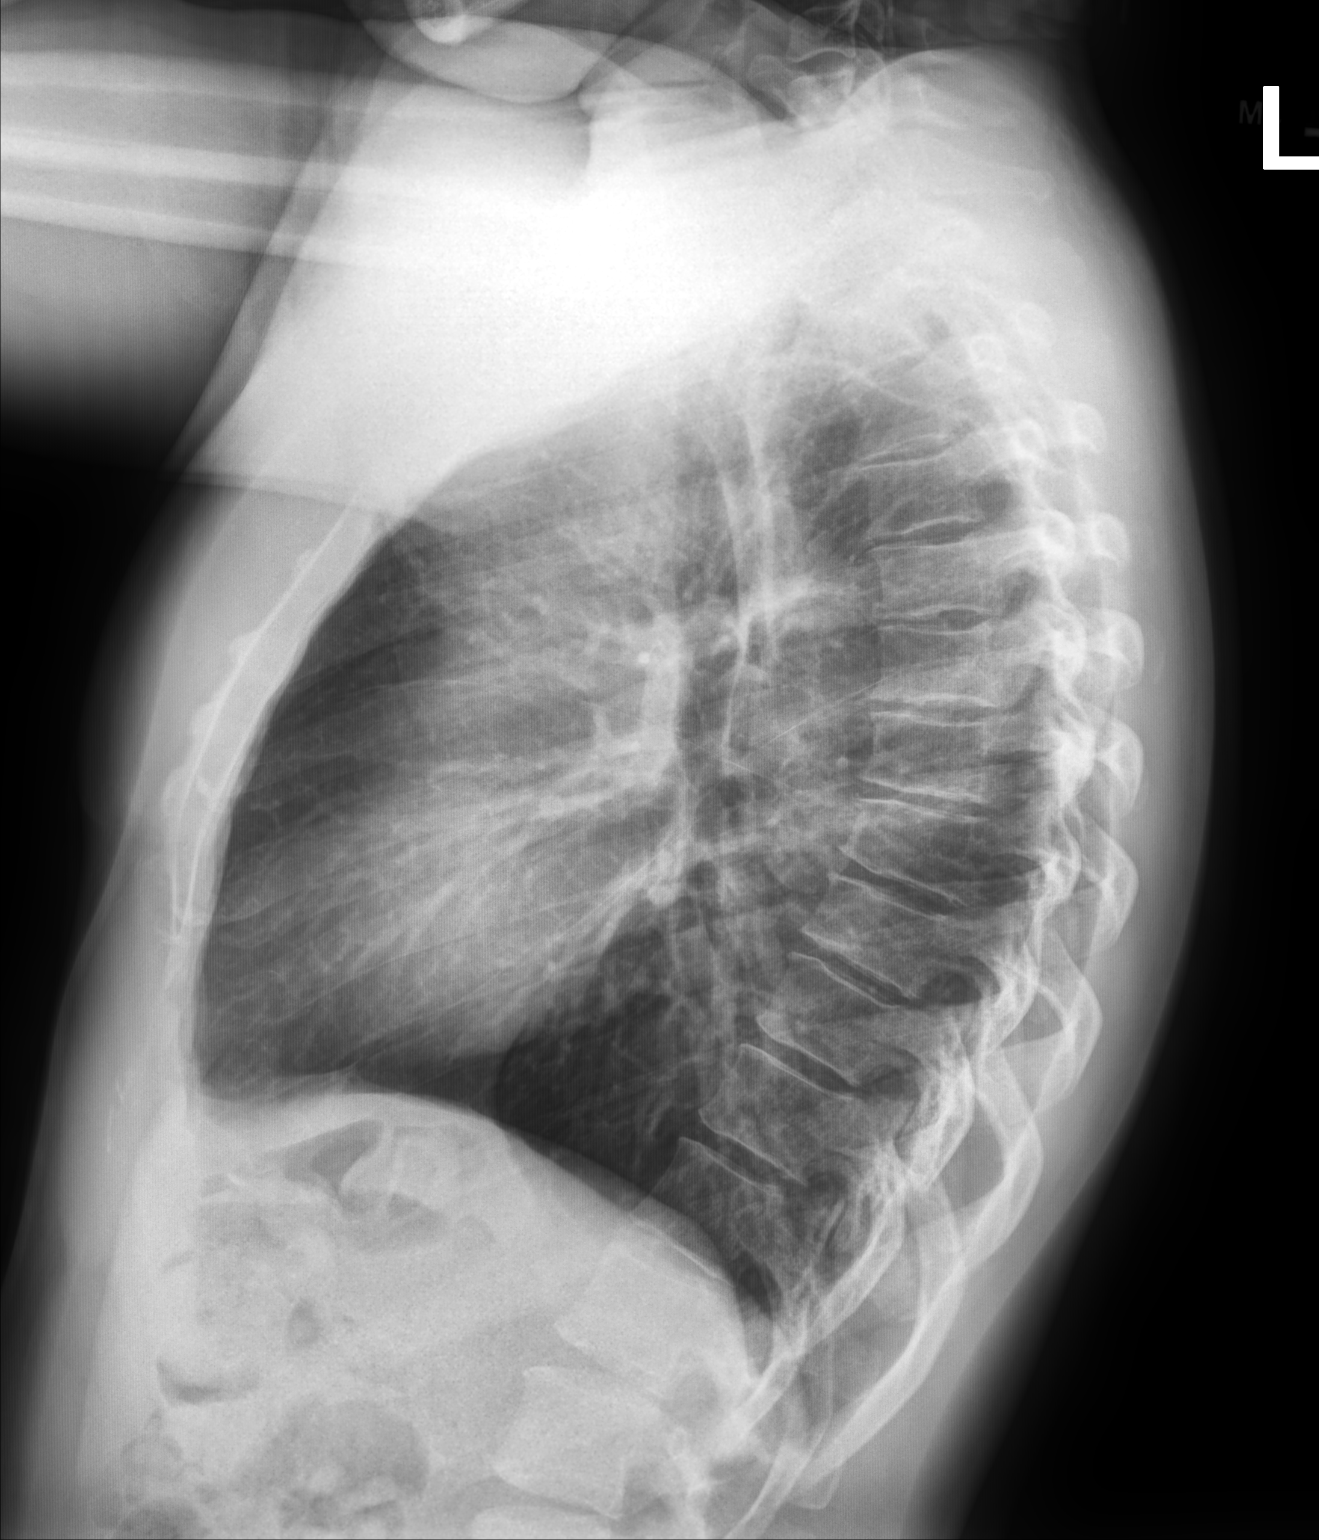

[2 of 2 positions shown; findings below may reference images not displayed]

FINDINGS: Normal heart size. Normal mediastinal contour. No pneumothorax. No
pleural effusion. Lungs appear clear, with no acute consolidative
airspace disease and no pulmonary edema.
IMPRESSION: No active cardiopulmonary disease.

## 2023-04-15 NOTE — Progress Notes (Signed)
Patient ID: Spencer Wood, male    DOB: [birth year]  MRN: [id number]  CC: FMLA Paperwork  Subjective: Spencer Wood is a 31 y.o. male who presents for FMLA paperwork.   His concerns today include:   Patient Active Problem List   Diagnosis Date Noted   Low back pain 04/07/2022   Seasonal and perennial allergic rhinitis 12/07/2021   Dyspnea on exertion 12/07/2021     Current Outpatient Medications on File Prior to Visit  Medication Sig Dispense Refill   albuterol (VENTOLIN HFA) 108 (90 Base) MCG/ACT inhaler INHALE 2 PUFFS INTO THE LUNGS EVERY 6 HOURS AS NEEDED FOR WHEEZING OR SHORTNESS OF BREATH 18 g 0   Azelastine-Fluticasone 137-50 MCG/ACT SUSP Place 1 spray into the nose daily as needed.     fexofenadine (ALLEGRA) 180 MG tablet Take 180 mg by mouth daily.     NONFORMULARY OR COMPOUNDED ITEM Take 1 Dose by mouth daily as needed. Raw Green Powder     Olopatadine HCl (PATADAY) 0.2 % SOLN Place 1 drop into both eyes daily. 2.5 mL 2   OVER THE COUNTER MEDICATION Take 1 Dose by mouth daily. Eco Drink     No current facility-administered medications on file prior to visit.    Allergies  Allergen Reactions   Cephalexin     Social History   Socioeconomic History   Marital status: Married    Spouse name: Not on file   Number of children: Not on file   Years of education: Not on file   Highest education level: Not on file  Occupational History   Not on file  Tobacco Use   Smoking status: Never    Passive exposure: Never   Smokeless tobacco: Never  Vaping Use   Vaping Use: Never used  Substance and Sexual Activity   Alcohol use: Yes    Alcohol/week: 1.0 standard drink of alcohol    Types: 1 Standard drinks or equivalent per week    Comment: Periodically   Drug use: Never   Sexual activity: Yes  Other Topics Concern   Not on file  Social History Narrative   Not on file   Social Determinants of Health   Financial Resource Strain: Not on file  Food Insecurity:  Not on file  Transportation Needs: Not on file  Physical Activity: Not on file  Stress: Not on file  Social Connections: Not on file  Intimate Partner Violence: Not on file    Family History  Problem Relation Age of Onset   Allergic rhinitis Mother    Hypertension Father     No past surgical history on file.  ROS: Review of Systems Negative except as stated above  PHYSICAL EXAM: There were no vitals taken for this visit.  Physical Exam  {male adult master:310786} {male adult master:310785}     Latest Ref Rng & Units 10/05/2021    8:54 AM  CMP  Glucose 70 - 99 mg/dL 83   BUN 6 - 20 mg/dL 14   Creatinine 4.78 - 1.27 mg/dL 2.95   Sodium 621 - 308 mmol/L 141   Potassium 3.5 - 5.2 mmol/L 4.3   Chloride 96 - 106 mmol/L 101   CO2 20 - 29 mmol/L 26   Calcium 8.7 - 10.2 mg/dL 9.5   Total Protein 6.0 - 8.5 g/dL 7.8   Total Bilirubin 0.0 - 1.2 mg/dL 0.7   Alkaline Phos 44 - 121 IU/L 51   AST 0 - 40 IU/L 28   ALT  0 - 44 IU/L 36    Lipid Panel     Component Value Date/Time   CHOL 247 (H) 10/05/2021 0854   TRIG 47 10/05/2021 0854   HDL 77 10/05/2021 0854   CHOLHDL 3.2 10/05/2021 0854   LDLCALC 163 (H) 10/05/2021 0854    CBC    Component Value Date/Time   WBC 2.7 (L) 10/05/2021 0854   RBC 4.85 10/05/2021 0854   HGB 15.2 10/05/2021 0854   HCT 45.2 10/05/2021 0854   PLT 308 10/05/2021 0854   MCV 93 10/05/2021 0854   MCH 31.3 10/05/2021 0854   MCHC 33.6 10/05/2021 0854   RDW 11.1 (L) 10/05/2021 0854    ASSESSMENT AND PLAN:  There are no diagnoses linked to this encounter.   Patient was given the opportunity to ask questions.  Patient verbalized understanding of the plan and was able to repeat key elements of the plan. Patient was given clear instructions to go to Emergency Department or return to medical center if symptoms don't improve, worsen, or new problems develop.The patient verbalized understanding.   No orders of the defined types were placed in  this encounter.    Requested Prescriptions    No prescriptions requested or ordered in this encounter    No follow-ups on file.  Rema Fendt, NP

## 2023-04-18 ENCOUNTER — Encounter: Payer: BC Managed Care – PPO | Admitting: Family

## 2023-04-18 VITALS — BP 112/75 | HR 65 | Temp 97.7°F | Resp 16 | Wt 161.8 lb

## 2023-04-18 DIAGNOSIS — Z0289 Encounter for other administrative examinations: Secondary | ICD-10-CM

## 2023-04-18 NOTE — Progress Notes (Signed)
Patient want FMLA for him, for wife.  Patient has no other concerns today

## 2023-04-19 ENCOUNTER — Telehealth: Payer: Self-pay | Admitting: *Deleted

## 2023-04-19 NOTE — Progress Notes (Unsigned)
Patient ID: Spencer Wood, male    DOB: [birth year]  MRN: [id number]  CC: FMLA Paperwork  Subjective: Spencer Wood is a 32 y.o. male who presents for FMLA paperwork.   His concerns today include:  Patient presents today with FMLA paperwork completion. He is a full time employee at [employer], Monday through Friday from 0800 - 16430. Patient will be assisting his wife with medical appointments to Psychiatry for management of PTSD and home care/activities of daily living. He has a copy of his wife's completed paperwork [employer] Leave of Absence Return to Work Form) from her psychiatrist for my review and to reflect his paperwork on today. FMLA will begin 04/16/2023 - 10/05/2023 for 2 times per month, 3 days per episode. No further issues/concerns for discussion today.  Patient Active Problem List   Diagnosis Date Noted   Low back pain 04/07/2022   Seasonal and perennial allergic rhinitis 12/07/2021   Dyspnea on exertion 12/07/2021     Current Outpatient Medications on File Prior to Visit  Medication Sig Dispense Refill   albuterol (VENTOLIN HFA) 108 (90 Base) MCG/ACT inhaler INHALE 2 PUFFS INTO THE LUNGS EVERY 6 HOURS AS NEEDED FOR WHEEZING OR SHORTNESS OF BREATH 18 g 0   Azelastine-Fluticasone 137-50 MCG/ACT SUSP Place 1 spray into the nose daily as needed.     fexofenadine (ALLEGRA) 180 MG tablet Take 180 mg by mouth daily.     NONFORMULARY OR COMPOUNDED ITEM Take 1 Dose by mouth daily as needed. Raw Green Powder     Olopatadine HCl (PATADAY) 0.2 % SOLN Place 1 drop into both eyes daily. 2.5 mL 2   OVER THE COUNTER MEDICATION Take 1 Dose by mouth daily. Eco Drink     No current facility-administered medications on file prior to visit.    Allergies  Allergen Reactions   Cephalexin     Social History   Socioeconomic History   Marital status: Married    Spouse name: Not on file   Number of children: Not on file   Years of education: Not on file   Highest  education level: Bachelor's degree (e.g., BA, AB, BS)  Occupational History   Not on file  Tobacco Use   Smoking status: Never    Passive exposure: Never   Smokeless tobacco: Never  Vaping Use   Vaping Use: Never used  Substance and Sexual Activity   Alcohol use: Yes    Alcohol/week: 1.0 standard drink of alcohol    Types: 1 Standard drinks or equivalent per week    Comment: Periodically   Drug use: Never   Sexual activity: Yes  Other Topics Concern   Not on file  Social History Narrative   Not on file   Social Determinants of Health   Financial Resource Strain: Low Risk  (04/18/2023)   Overall Financial Resource Strain (CARDIA)    Difficulty of Paying Living Expenses: Not hard at all  Food Insecurity: No Food Insecurity (04/18/2023)   Hunger Vital Sign    Worried About Running Out of Food in the Last Year: Never true    Ran Out of Food in the Last Year: Never true  Transportation Needs: No Transportation Needs (04/18/2023)   PRAPARE - Administrator, Civil Service (Medical): No    Lack of Transportation (Non-Medical): No  Physical Activity: Sufficiently Active (04/18/2023)   Exercise Vital Sign    Days of Exercise per Week: 3 days    Minutes of Exercise per Session: 120  min  Stress: No Stress Concern Present (04/18/2023)   [employer] of Occupational Health - Occupational Stress Questionnaire    Feeling of Stress : Not at all  Social Connections: Unknown (04/18/2023)   Social Connection and Isolation Panel [NHANES]    Frequency of Communication with Friends and Family: Once a week    Frequency of Social Gatherings with Friends and Family: Once a week    Attends Religious Services: Patient declined    Database administrator or Organizations: No    Attends Engineer, structural: Not on file    Marital Status: Married  Catering manager Violence: Not on file    Family History  Problem Relation Age of Onset   Allergic rhinitis Mother    Hypertension  Father     No past surgical history on file.  ROS: Review of Systems Negative except as stated above  PHYSICAL EXAM: BP 119/84   Pulse 61   Temp 98.1 F (36.7 C) (Oral)   Resp 16   Wt 161 lb (73 kg)   SpO2 98%   BMI 30.42 kg/m   Physical Exam HENT:     Head: Normocephalic and atraumatic.  Eyes:     Extraocular Movements: Extraocular movements intact.     Conjunctiva/sclera: Conjunctivae normal.     Pupils: Pupils are equal, round, and reactive to light.  Cardiovascular:     Rate and Rhythm: Normal rate and regular rhythm.     Pulses: Normal pulses.     Heart sounds: Normal heart sounds.  Pulmonary:     Effort: Pulmonary effort is normal.     Breath sounds: Normal breath sounds.  Musculoskeletal:     Cervical back: Normal range of motion and neck supple.  Neurological:     General: No focal deficit present.     Mental Status: He is alert and oriented to person, place, and time.  Psychiatric:        Mood and Affect: Mood normal.        Behavior: Behavior normal.     ASSESSMENT AND PLAN: 1. Encounter for completion of form with patient 2. PTSD (post-traumatic stress disorder) - FMLA paperwork completed today in office. FMLA will begin 04/16/2023 - 10/05/2023 for 2 times per month, 3 days per episode.    Patient was given the opportunity to ask questions.  Patient verbalized understanding of the plan and was able to repeat key elements of the plan. Patient was given clear instructions to go to Emergency Department or return to medical center if symptoms don't improve, worsen, or new problems develop.The patient verbalized understanding.   Follow-up with primary provider as scheduled.  Rema Fendt, NP

## 2023-04-19 NOTE — Telephone Encounter (Signed)
Patient was called to see if he would like to come and see Amy due to unforseen emergency on previous day. LVM

## 2023-04-20 ENCOUNTER — Ambulatory Visit (INDEPENDENT_AMBULATORY_CARE_PROVIDER_SITE_OTHER): Payer: BC Managed Care – PPO | Admitting: Family

## 2023-04-20 VITALS — BP 119/84 | HR 61 | Temp 98.1°F | Resp 16 | Wt 161.0 lb

## 2023-04-20 DIAGNOSIS — Z0289 Encounter for other administrative examinations: Secondary | ICD-10-CM

## 2023-04-20 DIAGNOSIS — F4310 Post-traumatic stress disorder, unspecified: Secondary | ICD-10-CM

## 2023-04-20 NOTE — Progress Notes (Signed)
Patient is here for paperwork to be done for family member.FMLA - Patient has no other concerns

## 2023-04-26 ENCOUNTER — Ambulatory Visit: Payer: BC Managed Care – PPO | Admitting: Family

## 2023-10-19 ENCOUNTER — Ambulatory Visit (INDEPENDENT_AMBULATORY_CARE_PROVIDER_SITE_OTHER): Payer: BC Managed Care – PPO | Admitting: Family

## 2023-10-19 VITALS — BP 127/89 | HR 75 | Temp 97.8°F | Ht 61.0 in | Wt 171.4 lb

## 2023-10-19 DIAGNOSIS — F32A Depression, unspecified: Secondary | ICD-10-CM

## 2023-10-19 DIAGNOSIS — F4310 Post-traumatic stress disorder, unspecified: Secondary | ICD-10-CM | POA: Diagnosis not present

## 2023-10-19 DIAGNOSIS — Z0289 Encounter for other administrative examinations: Secondary | ICD-10-CM | POA: Diagnosis not present

## 2023-10-19 NOTE — Progress Notes (Signed)
Patient states no other concerns to discuss.

## 2023-10-19 NOTE — Progress Notes (Signed)
Patient ID: Spencer Wood, male    DOB: November 26, 1990  MRN: [id number]  CC: FMLA Paperwork   Subjective: Spencer Wood is a 32 y.o. male who presents for FMLA paperwork.   His concerns today include:  Patient presents for completion of FMLA paperwork so that he may be able to accompany his wife to doctor's appointments and assist with any episodes related to depression and PTSD. 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 No further issues/concerns for discussion today.   Patient Active Problem List   Diagnosis Date Noted   Low back pain 04/07/2022   Seasonal and perennial allergic rhinitis 12/07/2021   Dyspnea on exertion 12/07/2021     Current Outpatient Medications on File Prior to Visit  Medication Sig Dispense Refill   albuterol (VENTOLIN HFA) 108 (90 Base) MCG/ACT inhaler INHALE 2 PUFFS INTO THE LUNGS EVERY 6 HOURS AS NEEDED FOR WHEEZING OR SHORTNESS OF BREATH 18 g 0   Azelastine-Fluticasone 137-50 MCG/ACT SUSP Place 1 spray into the nose daily as needed.     Olopatadine HCl (PATADAY) 0.2 % SOLN Place 1 drop into both eyes daily. 2.5 mL 2   fexofenadine (ALLEGRA) 180 MG tablet Take 180 mg by mouth daily.     NONFORMULARY OR COMPOUNDED ITEM Take 1 Dose by mouth daily as needed. Raw Green Powder     OVER THE COUNTER MEDICATION Take 1 Dose by mouth daily. Eco Drink     No current facility-administered medications on file prior to visit.    Allergies  Allergen Reactions   Cephalexin     Social History   Socioeconomic History   Marital status: Married    Spouse name: Not on file   Number of children: Not on file   Years of education: Not on file   Highest education level: Bachelor's degree (e.g., BA, AB, BS)  Occupational History   Not on file  Tobacco Use   Smoking status: Never    Passive exposure: Never   Smokeless tobacco: Never  Vaping Use   Vaping status: Never Used  Substance and Sexual Activity   Alcohol use: Yes     Alcohol/week: 1.0 standard drink of alcohol    Types: 1 Standard drinks or equivalent per week    Comment: Periodically   Drug use: Never   Sexual activity: Yes  Other Topics Concern   Not on file  Social History Narrative   Not on file   Social Determinants of Health   Financial Resource Strain: Low Risk  (10/19/2023)   Overall Financial Resource Strain (CARDIA)    Difficulty of Paying Living Expenses: Not hard at all  Food Insecurity: No Food Insecurity (10/19/2023)   Hunger Vital Sign    Worried About Running Out of Food in the Last Year: Never true    Ran Out of Food in the Last Year: Never true  Transportation Needs: No Transportation Needs (10/19/2023)   PRAPARE - Administrator, Civil Service (Medical): No    Lack of Transportation (Non-Medical): No  Physical Activity: Sufficiently Active (10/19/2023)   Exercise Vital Sign    Days of Exercise per Week: 3 days    Minutes of Exercise per Session: 60 min  Stress: No Stress Concern Present (10/19/2023)   [employer] of Occupational Health - Occupational Stress Questionnaire    Feeling of Stress : Not at all  Social Connections: Moderately Isolated (10/19/2023)   Social Connection and Isolation Panel [NHANES]  Frequency of Communication with Friends and Family: Twice a week    Frequency of Social Gatherings with Friends and Family: Once a week    Attends Religious Services: Never    Database administrator or Organizations: No    Attends Engineer, structural: Not on file    Marital Status: Married  Catering manager Violence: Not on file    Family History  Problem Relation Age of Onset   Allergic rhinitis Mother    Hypertension Father     No past surgical history on file.  ROS: Review of Systems Negative except as stated above  PHYSICAL EXAM: BP 127/89   Pulse 75   Temp 97.8 F (36.6 C) (Oral)   Ht 5\' 1"  (1.549 m)   Wt 171 lb 6.4 oz (77.7 kg)   SpO2 98%   BMI 32.39 kg/m   Physical  Exam HENT:     Head: Normocephalic and atraumatic.     Nose: Nose normal.     Mouth/Throat:     Mouth: Mucous membranes are moist.     Pharynx: Oropharynx is clear.  Eyes:     Extraocular Movements: Extraocular movements intact.     Conjunctiva/sclera: Conjunctivae normal.     Pupils: Pupils are equal, round, and reactive to light.  Cardiovascular:     Rate and Rhythm: Normal rate and regular rhythm.     Pulses: Normal pulses.     Heart sounds: Normal heart sounds.  Pulmonary:     Effort: Pulmonary effort is normal.     Breath sounds: Normal breath sounds.  Musculoskeletal:        General: Normal range of motion.     Cervical back: Normal range of motion and neck supple.  Neurological:     General: No focal deficit present.     Mental Status: He is alert and oriented to person, place, and time.  Psychiatric:        Mood and Affect: Mood normal.        Behavior: Behavior normal.     ASSESSMENT AND PLAN: 1. Encounter for completion of form with patient 2. Depression, unspecified depression type 3. PTSD (post-traumatic stress disorder) - FMLA paperwork completed today in office. FMLA will begin 10/19/2023 - 04/18/2024 for 2 times per month, 3 days per episode.    Patient was given the opportunity to ask questions.  Patient verbalized understanding of the plan and was able to repeat key elements of the plan. Patient was given clear instructions to go to Emergency Department or return to medical center if symptoms don't improve, worsen, or new problems develop.The patient verbalized understanding.   Return in about 6 months (around 04/18/2024) for Follow-Up or next available FMLA paperwork .  Rema Fendt, NP

## 2024-02-22 ENCOUNTER — Encounter: Payer: Self-pay | Admitting: Family

## 2024-02-27 ENCOUNTER — Ambulatory Visit: Payer: Self-pay | Admitting: Family

## 2024-02-27 NOTE — Telephone Encounter (Signed)
 Dry cough x1 month  Symptoms: Sometimes keeps pt up at night or wakes him up from sleep, intermittent difficulty breathing ("especially when lying on side"), uncomfortable sensation in chest when coughing, runny nose (clear to pale yellow in color), "irritated throat" Frequency: "Consistent cough" Pertinent Negatives: Patient denies hemoptysis, fever, wheezing   [x] Urgent Care (no appt availability in office)    Additional Notes: Pt states he feels it "originating in chest area." This RN advised pt to be seen today based off symptoms. This RN told pt to go to urgent care as no available appointments in office. Pt requests an appt tomorrow with PCP to discuss FMLA paperwork. This RN made pt an appointment but emphasized importance of still going to urgent care today for symptoms. This RN educated pt on when to call back/seek emergent care. Pt verbalized understanding and agrees to plan.      Copied from CRM [id number]. Topic: Appointments - Appointment Scheduling >> Feb 27, 2024  9:52 AM Lane Pinon A wrote: Patient/patient representative is calling to schedule an appointment. Refer to attachments for appointment information. Reason for Disposition  [1] MILD difficulty breathing (e.g., minimal/no SOB at rest, SOB with walking, pulse <100) AND [2] still present when not coughing  Answer Assessment - Initial Assessment Questions Dry cough x1 month Moderate difficulty breathing  Symptoms: Sometimes keeps pt up at night or wakes him up from sleep, intermittent difficulty breathing ("especially when lying on side"), uncomfortable sensation in chest when coughing, runny nose (clear to pale yellow in color), "irritated" throat  Frequency: "Consistent cough"  Pertinent Negatives: Patient denies hemoptysis, fever,wheezing  Protocols used: Cough - Acute Non-Productive-A-AH

## 2024-02-27 NOTE — Telephone Encounter (Signed)
 Copied from CRM [id number]. Topic: Appointments - Appointment Scheduling >> Feb 27, 2024 10:03 AM Lane Pinon A wrote: Patient/patient representative is calling to schedule an appointment. Refer to attachments for appointment information.

## 2024-02-27 NOTE — Telephone Encounter (Signed)
 Copied from CRM [id number]. Topic: Appointments - Appointment Scheduling >> Feb 27, 2024  9:52 AM Lane Pinon A wrote: Patient/patient representative is calling to schedule an appointment. Refer to attachments for appointment information.

## 2024-02-28 ENCOUNTER — Ambulatory Visit (INDEPENDENT_AMBULATORY_CARE_PROVIDER_SITE_OTHER): Admitting: Family

## 2024-02-28 ENCOUNTER — Ambulatory Visit (INDEPENDENT_AMBULATORY_CARE_PROVIDER_SITE_OTHER)

## 2024-02-28 ENCOUNTER — Encounter: Payer: Self-pay | Admitting: Family

## 2024-02-28 VITALS — BP 133/88 | HR 68 | Temp 98.2°F | Ht 62.0 in | Wt 174.0 lb

## 2024-02-28 DIAGNOSIS — R059 Cough, unspecified: Secondary | ICD-10-CM | POA: Diagnosis not present

## 2024-02-28 DIAGNOSIS — Z0289 Encounter for other administrative examinations: Secondary | ICD-10-CM

## 2024-02-28 DIAGNOSIS — F411 Generalized anxiety disorder: Secondary | ICD-10-CM | POA: Diagnosis not present

## 2024-02-28 DIAGNOSIS — J3089 Other allergic rhinitis: Secondary | ICD-10-CM

## 2024-02-28 MED ORDER — PSEUDOEPH-BROMPHEN-DM 30-2-10 MG/5ML PO SYRP: 5.0000 mL | ORAL_SOLUTION | Freq: Four times a day (QID) | ORAL | 0 refills | Status: AC | PRN

## 2024-02-28 MED ORDER — ALBUTEROL SULFATE HFA 108 (90 BASE) MCG/ACT IN AERS: 1.0000 | INHALATION_SPRAY | Freq: Four times a day (QID) | RESPIRATORY_TRACT | 2 refills | Status: AC | PRN

## 2024-02-28 MED ORDER — AZELASTINE-FLUTICASONE 137-50 MCG/ACT NA SUSP
1.0000 | Freq: Every day | NASAL | 2 refills | Status: AC | PRN
Start: 2024-02-28 — End: ?

## 2024-02-28 MED ORDER — FEXOFENADINE HCL 180 MG PO TABS: 180.0000 mg | ORAL_TABLET | Freq: Every day | ORAL | 0 refills | Status: DC

## 2024-02-28 MED ORDER — BENZONATATE 100 MG PO CAPS: 100.0000 mg | ORAL_CAPSULE | Freq: Three times a day (TID) | ORAL | 2 refills | Status: AC | PRN

## 2024-02-28 NOTE — Progress Notes (Signed)
 Cough for a month.   Discomfort in chest when he exhales.  Sometimes when laying he has difficulty breathing.  Sometimes he feels like he has to cough something up and nothing comes up.  This happens yearly in the spring time.  Mother has seasonal allergies.  He wakes up in the middle of the night coughing.

## 2024-02-28 NOTE — Progress Notes (Signed)
 Patient ID: Spencer Wood, male    DOB: 1991/02/15  MRN: [id number]  CC: Cough  Subjective: Spencer Wood is a 33 y.o. male who presents for cough. He is accompanied by his wife.  His concerns today include:  - Cough persisting for 1 month. Reports chest discomfort when laying down and intermittent shortness of breath. Denies red flag symptoms. States thinks related to allergies. Doing well on Fexofenadine and Azelastine-Fluticasone, no issues/concerns. States he was seen by Allergy and he declined allergy injections. States he was seen by Pulmonology and was told nothing is wrong. - Anxiety. States anxiety related to his job and assisting his wife with her medical conditions. States his present symptoms are "mood swings", "burnout", "shaking", "decreased sleep", "decreased focus", "crying spells", and "panic attacks". States he needs to take time away from work and plans to bring paperwork for completion at later date.  Patient Active Problem List   Diagnosis Date Noted   Low back pain 04/07/2022   Seasonal and perennial allergic rhinitis 12/07/2021   Dyspnea on exertion 12/07/2021     Current Outpatient Medications on File Prior to Visit  Medication Sig Dispense Refill   fexofenadine (ALLEGRA) 180 MG tablet Take 180 mg by mouth daily.     NONFORMULARY OR COMPOUNDED ITEM Take 1 Dose by mouth daily as needed. Raw Green Powder     Olopatadine HCl (PATADAY) 0.2 % SOLN Place 1 drop into both eyes daily. (Patient not taking: Reported on 02/28/2024) 2.5 mL 2   OVER THE COUNTER MEDICATION Take 1 Dose by mouth daily. Eco Drink     No current facility-administered medications on file prior to visit.    Allergies  Allergen Reactions   Cephalexin     Social History   Socioeconomic History   Marital status: Married    Spouse name: Not on file   Number of children: Not on file   Years of education: Not on file   Highest education level: Bachelor's degree (e.g., BA, AB, BS)   Occupational History   Not on file  Tobacco Use   Smoking status: Never    Passive exposure: Never   Smokeless tobacco: Never  Vaping Use   Vaping status: Never Used  Substance and Sexual Activity   Alcohol use: Yes    Alcohol/week: 1.0 standard drink of alcohol    Types: 1 Standard drinks or equivalent per week    Comment: Periodically   Drug use: Never   Sexual activity: Yes  Other Topics Concern   Not on file  Social History Narrative   Not on file   Social Drivers of Health   Financial Resource Strain: Low Risk  (10/19/2023)   Overall Financial Resource Strain (CARDIA)    Difficulty of Paying Living Expenses: Not hard at all  Food Insecurity: No Food Insecurity (10/19/2023)   Hunger Vital Sign    Worried About Running Out of Food in the Last Year: Never true    Ran Out of Food in the Last Year: Never true  Transportation Needs: No Transportation Needs (10/19/2023)   PRAPARE - Administrator, Civil Service (Medical): No    Lack of Transportation (Non-Medical): No  Physical Activity: Sufficiently Active (10/19/2023)   Exercise Vital Sign    Days of Exercise per Week: 3 days    Minutes of Exercise per Session: 60 min  Stress: No Stress Concern Present (10/19/2023)   [employer] of Occupational Health - Occupational Stress Questionnaire    Feeling  of Stress : Not at all  Social Connections: Moderately Isolated (10/19/2023)   Social Connection and Isolation Panel [NHANES]    Frequency of Communication with Friends and Family: Twice a week    Frequency of Social Gatherings with Friends and Family: Once a week    Attends Religious Services: Never    Database administrator or Organizations: No    Attends Engineer, structural: Not on file    Marital Status: Married  Catering manager Violence: Not At Risk (10/19/2023)   Humiliation, Afraid, Rape, and Kick questionnaire    Fear of Current or Ex-Partner: No    Emotionally Abused: No    Physically  Abused: No    Sexually Abused: No    Family History  Problem Relation Age of Onset   Allergic rhinitis Mother    Hypertension Father     History reviewed. No pertinent surgical history.  ROS: Review of Systems Negative except as stated above  PHYSICAL EXAM: BP 133/88   Pulse 68   Temp 98.2 F (36.8 C) (Oral)   Ht 5\' 2"  (1.575 m)   Wt 174 lb (78.9 kg)   SpO2 97%   BMI 31.83 kg/m   Physical Exam HENT:     Head: Normocephalic and atraumatic.     Nose: Nose normal.     Mouth/Throat:     Mouth: Mucous membranes are moist.     Pharynx: Oropharynx is clear.  Eyes:     Extraocular Movements: Extraocular movements intact.     Conjunctiva/sclera: Conjunctivae normal.     Pupils: Pupils are equal, round, and reactive to light.  Cardiovascular:     Rate and Rhythm: Normal rate and regular rhythm.     Pulses: Normal pulses.     Heart sounds: Normal heart sounds.  Pulmonary:     Effort: Pulmonary effort is normal.     Breath sounds: Normal breath sounds.  Musculoskeletal:        General: Normal range of motion.     Cervical back: Normal range of motion and neck supple.  Neurological:     General: No focal deficit present.     Mental Status: He is alert and oriented to person, place, and time.  Psychiatric:        Mood and Affect: Mood normal.        Behavior: Behavior normal.      ASSESSMENT AND PLAN: 1. Cough, unspecified type (Primary) - Patient today in office with no cardiopulmonary/acute distress.  - Patient declined respiratory panel. - Brompheniramine-Pseudoephedrine-DM, Benzonatate capsules, and Albuterol inhaler as prescribed. Counseled on medication adherence/adverse effects. - Diagnostic chest xray for evaluation. - Patient declined referral to Pulmonology.  - Follow-up with primary provider as scheduled. - DG Chest 2 View; Future - brompheniramine-pseudoephedrine-DM 30-2-10 MG/5ML syrup; Take 5 mLs by mouth 4 (four) times daily as needed.  Dispense: 120  mL; Refill: 0 - benzonatate (TESSALON PERLES) 100 MG capsule; Take 1 capsule (100 mg total) by mouth 3 (three) times daily as needed for cough.  Dispense: 30 capsule; Refill: 2 - albuterol (VENTOLIN HFA) 108 (90 Base) MCG/ACT inhaler; Inhale 1-2 puffs into the lungs every 6 (six) hours as needed for wheezing or shortness of breath.  Dispense: 18 g; Refill: 2  2. Perennial allergic rhinitis - Continue Fexofenadine and Azelastine-Fluticasone as prescribed. Counseled on medication adherence/adverse effects. - Patient declined referral to Allergy.  - Follow-up with primary provider as scheduled. - Azelastine-Fluticasone 137-50 MCG/ACT SUSP; Place 1 spray into  the nose daily as needed.  Dispense: 23 g; Refill: 2 - fexofenadine (ALLEGRA) 180 MG tablet; Take 1 tablet (180 mg total) by mouth daily.  Dispense: 90 tablet; Refill: 0  3. Encounter for completion of form with patient 4. Generalized anxiety disorder - Patient denies thoughts of self-harm, suicidal ideations, homicidal ideations. - Patient declined pharmacological therapy.  - Patient declined referral to Psychiatry. - Patient to bring paperwork for completion at later date.  - Follow-up with primary provider as scheduled.   Patient was given the opportunity to ask questions.  Patient verbalized understanding of the plan and was able to repeat key elements of the plan. Patient was given clear instructions to go to Emergency Department or return to medical center if symptoms don't improve, worsen, or new problems develop.The patient verbalized understanding.   Orders Placed This Encounter  Procedures   DG Chest 2 View     Requested Prescriptions   Signed Prescriptions Disp Refills   brompheniramine-pseudoephedrine-DM 30-2-10 MG/5ML syrup 120 mL 0    Sig: Take 5 mLs by mouth 4 (four) times daily as needed.   benzonatate (TESSALON PERLES) 100 MG capsule 30 capsule 2    Sig: Take 1 capsule (100 mg total) by mouth 3 (three) times daily  as needed for cough.   albuterol (VENTOLIN HFA) 108 (90 Base) MCG/ACT inhaler 18 g 2    Sig: Inhale 1-2 puffs into the lungs every 6 (six) hours as needed for wheezing or shortness of breath.   Azelastine-Fluticasone 137-50 MCG/ACT SUSP 23 g 2    Sig: Place 1 spray into the nose daily as needed.   fexofenadine (ALLEGRA) 180 MG tablet 90 tablet 0    Sig: Take 1 tablet (180 mg total) by mouth daily.    Follow-up with primary provider as scheduled.  Senaida Dama, NP

## 2024-02-29 ENCOUNTER — Encounter: Payer: Self-pay | Admitting: Family

## 2024-04-18 ENCOUNTER — Ambulatory Visit (INDEPENDENT_AMBULATORY_CARE_PROVIDER_SITE_OTHER): Payer: BC Managed Care – PPO | Admitting: Family

## 2024-04-18 VITALS — BP 117/82 | HR 66 | Temp 98.0°F | Resp 16 | Ht 61.0 in | Wt 173.2 lb

## 2024-04-18 DIAGNOSIS — Z0289 Encounter for other administrative examinations: Secondary | ICD-10-CM

## 2024-04-18 NOTE — Progress Notes (Signed)
 6 month follow up and fmla paperwork

## 2024-04-18 NOTE — Progress Notes (Signed)
 Patient ID: Spencer Wood, male    DOB: 01/23/1991  MRN: [id number]  CC: FMLA Paperwork  Subjective: Spencer Wood is a 33 y.o. male who presents for FMLA paperwork.   His concerns today include:  Patient presents today for FMLA paperwork completion to be able to assist his wife with her medical care and medical appointments. 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 No further issues/concerns for discussion today.  Patient Active Problem List   Diagnosis Date Noted   Low back pain 04/07/2022   Seasonal and perennial allergic rhinitis 12/07/2021   Dyspnea on exertion 12/07/2021     Current Outpatient Medications on File Prior to Visit  Medication Sig Dispense Refill   albuterol  (VENTOLIN  HFA) 108 (90 Base) MCG/ACT inhaler Inhale 1-2 puffs into the lungs every 6 (six) hours as needed for wheezing or shortness of breath. 18 g 2   Azelastine -Fluticasone  137-50 MCG/ACT SUSP Place 1 spray into the nose daily as needed. 23 g 2   benzonatate  (TESSALON  PERLES) 100 MG capsule Take 1 capsule (100 mg total) by mouth 3 (three) times daily as needed for cough. 30 capsule 2   brompheniramine-pseudoephedrine-DM 30-2-10 MG/5ML syrup Take 5 mLs by mouth 4 (four) times daily as needed. 120 mL 0   fexofenadine  (ALLEGRA ) 180 MG tablet Take 180 mg by mouth daily.     fexofenadine  (ALLEGRA ) 180 MG tablet Take 1 tablet (180 mg total) by mouth daily. 90 tablet 0   NONFORMULARY OR COMPOUNDED ITEM Take 1 Dose by mouth daily as needed. Raw Green Powder     Olopatadine  HCl (PATADAY ) 0.2 % SOLN Place 1 drop into both eyes daily. (Patient not taking: Reported on 02/28/2024) 2.5 mL 2   OVER THE COUNTER MEDICATION Take 1 Dose by mouth daily. Eco Drink     No current facility-administered medications on file prior to visit.    Allergies  Allergen Reactions   Cephalexin     Social History   Socioeconomic History   Marital status: Married    Spouse name: Not on file    Number of children: Not on file   Years of education: Not on file   Highest education level: Bachelor's degree (e.g., BA, AB, BS)  Occupational History   Not on file  Tobacco Use   Smoking status: Never    Passive exposure: Never   Smokeless tobacco: Never  Vaping Use   Vaping status: Never Used  Substance and Sexual Activity   Alcohol use: Yes    Alcohol/week: 1.0 standard drink of alcohol    Types: 1 Standard drinks or equivalent per week    Comment: Periodically   Drug use: Never   Sexual activity: Yes  Other Topics Concern   Not on file  Social History Narrative   Not on file   Social Drivers of Health   Financial Resource Strain: Low Risk  (04/18/2024)   Overall Financial Resource Strain (CARDIA)    Difficulty of Paying Living Expenses: Not hard at all  Food Insecurity: No Food Insecurity (04/18/2024)   Hunger Vital Sign    Worried About Running Out of Food in the Last Year: Never true    Ran Out of Food in the Last Year: Never true  Transportation Needs: No Transportation Needs (04/18/2024)   PRAPARE - Administrator, Civil Service (Medical): No    Lack of Transportation (Non-Medical): No  Physical Activity: Insufficiently Active (04/18/2024)   Exercise Vital Sign  Days of Exercise per Week: 3 days    Minutes of Exercise per Session: 40 min  Stress: Stress Concern Present (04/18/2024)   [employer] of Occupational Health - Occupational Stress Questionnaire    Feeling of Stress : To some extent  Social Connections: Socially Isolated (04/18/2024)   Social Connection and Isolation Panel [NHANES]    Frequency of Communication with Friends and Family: Once a week    Frequency of Social Gatherings with Friends and Family: Once a week    Attends Religious Services: Never    Database administrator or Organizations: No    Attends Engineer, structural: Not on file    Marital Status: Married  Catering manager Violence: Not At Risk (10/19/2023)    Humiliation, Afraid, Rape, and Kick questionnaire    Fear of Current or Ex-Partner: No    Emotionally Abused: No    Physically Abused: No    Sexually Abused: No    Family History  Problem Relation Age of Onset   Allergic rhinitis Mother    Hypertension Father     History reviewed. No pertinent surgical history.  ROS: Review of Systems Negative except as stated above  PHYSICAL EXAM: BP 117/82   Pulse 66   Temp 98 F (36.7 C) (Oral)   Resp 16   Ht 5\' 1"  (1.549 m)   Wt 173 lb 3.2 oz (78.6 kg)   SpO2 99%   BMI 32.73 kg/m   Physical Exam HENT:     Head: Normocephalic and atraumatic.     Nose: Nose normal.     Mouth/Throat:     Mouth: Mucous membranes are moist.     Pharynx: Oropharynx is clear.  Eyes:     Extraocular Movements: Extraocular movements intact.     Conjunctiva/sclera: Conjunctivae normal.     Pupils: Pupils are equal, round, and reactive to light.  Cardiovascular:     Rate and Rhythm: Normal rate and regular rhythm.     Pulses: Normal pulses.     Heart sounds: Normal heart sounds.  Pulmonary:     Effort: Pulmonary effort is normal.     Breath sounds: Normal breath sounds.  Musculoskeletal:        General: Normal range of motion.     Cervical back: Normal range of motion and neck supple.  Neurological:     General: No focal deficit present.     Mental Status: He is alert and oriented to person, place, and time.  Psychiatric:        Mood and Affect: Mood normal.        Behavior: Behavior normal.     ASSESSMENT AND PLAN: 1. Encounter for completion of form with patient (Primary) - FMLA paperwork completed in office. Beginning date 04/18/2024. Ending date 10/18/2024.   Patient was given the opportunity to ask questions.  Patient verbalized understanding of the plan and was able to repeat key elements of the plan. Patient was given clear instructions to go to Emergency Department or return to medical center if symptoms don't improve, worsen, or new  problems develop.The patient verbalized understanding.  Follow-up with primary provider as scheduled.  Senaida Dama, NP

## 2024-04-24 ENCOUNTER — Ambulatory Visit: Admitting: Family

## 2024-08-09 ENCOUNTER — Telehealth: Payer: Self-pay | Admitting: Emergency Medicine

## 2024-08-09 NOTE — Telephone Encounter (Signed)
 Copied from CRM [id number]. Topic: General - Other >> Aug 09, 2024  1:14 PM Spencer Wood wrote: Reason for CRM: pt wants to make a video visit asap to discuss LOA with Dr. Lorren please reach out to pt  I called patient and made him aware that he needed to schedule an appointment to come in for LOA paperwork.  Patient will call back in the am to schedule an appointment

## 2024-08-14 ENCOUNTER — Telehealth: Payer: Self-pay | Admitting: Family

## 2024-08-14 ENCOUNTER — Encounter: Payer: Self-pay | Admitting: Family

## 2024-08-14 ENCOUNTER — Ambulatory Visit (INDEPENDENT_AMBULATORY_CARE_PROVIDER_SITE_OTHER): Admitting: Family

## 2024-08-14 VITALS — BP 126/84 | HR 82 | Temp 98.3°F | Resp 16 | Ht 61.0 in | Wt 172.8 lb

## 2024-08-14 DIAGNOSIS — F32A Depression, unspecified: Secondary | ICD-10-CM | POA: Diagnosis not present

## 2024-08-14 DIAGNOSIS — Z0289 Encounter for other administrative examinations: Secondary | ICD-10-CM | POA: Diagnosis not present

## 2024-08-14 DIAGNOSIS — F419 Anxiety disorder, unspecified: Secondary | ICD-10-CM

## 2024-08-14 NOTE — Progress Notes (Signed)
 LOA paperwork, patient scored a 12 on the PHQ-9, patient scored a 13 on the GAD-7

## 2024-08-14 NOTE — Progress Notes (Signed)
 Patient ID: Spencer Wood, male    DOB: 03-28-1991  MRN: [id number]  CC: Paperwork  Subjective: Spencer Wood is a 33 y.o. male who presents for paperwork. He is accompanied by his wife.   His concerns today include:  States he would like to take Leave of Absence/Short Term Disability due to stress from his job. 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 States he considered taking Leave of Absence/Short Term Disability beginning 08/27/2024 for 12 weeks but after reconsideration he would like for the same to begin today 08/14/2024. States he feels burn out, panic attacks, decreased focus, decreased sleep, and crying. He does not want to try medication to see if this helps. He would like referral to a therapist. He denies thoughts of self-harm, suicidal ideations, homicidal ideations.  Patient Active Problem List   Diagnosis Date Noted   Low back pain 04/07/2022   Seasonal and perennial allergic rhinitis 12/07/2021   Dyspnea on exertion 12/07/2021     Current Outpatient Medications on File Prior to Visit  Medication Sig Dispense Refill   albuterol  (VENTOLIN  HFA) 108 (90 Base) MCG/ACT inhaler Inhale 1-2 puffs into the lungs every 6 (six) hours as needed for wheezing or shortness of breath. 18 g 2   Azelastine -Fluticasone  137-50 MCG/ACT SUSP Place 1 spray into the nose daily as needed. 23 g 2   benzonatate  (TESSALON  PERLES) 100 MG capsule Take 1 capsule (100 mg total) by mouth 3 (three) times daily as needed for cough. 30 capsule 2   brompheniramine-pseudoephedrine-DM 30-2-10 MG/5ML syrup Take 5 mLs by mouth 4 (four) times daily as needed. 120 mL 0   fexofenadine  (ALLEGRA ) 180 MG tablet Take 180 mg by mouth daily.     fexofenadine  (ALLEGRA ) 180 MG tablet Take 1 tablet (180 mg total) by mouth daily. 90 tablet 0   NONFORMULARY OR COMPOUNDED ITEM Take 1 Dose by mouth daily as needed. Raw Green Powder     Olopatadine  HCl (PATADAY ) 0.2 % SOLN Place 1 drop into both  eyes daily. (Patient not taking: Reported on 02/28/2024) 2.5 mL 2   OVER THE COUNTER MEDICATION Take 1 Dose by mouth daily. Eco Drink     No current facility-administered medications on file prior to visit.    Allergies  Allergen Reactions   Cephalexin     Social History   Socioeconomic History   Marital status: Married    Spouse name: Not on file   Number of children: Not on file   Years of education: Not on file   Highest education level: Bachelor's degree (e.g., BA, AB, BS)  Occupational History   Not on file  Tobacco Use   Smoking status: Never    Passive exposure: Never   Smokeless tobacco: Never  Vaping Use   Vaping status: Never Used  Substance and Sexual Activity   Alcohol use: Yes    Alcohol/week: 1.0 standard drink of alcohol    Types: 1 Standard drinks or equivalent per week    Comment: Periodically   Drug use: Never   Sexual activity: Yes  Other Topics Concern   Not on file  Social History Narrative   Not on file   Social Drivers of Health   Financial Resource Strain: Low Risk  (08/12/2024)   Overall Financial Resource Strain (CARDIA)    Difficulty of Paying Living Expenses: Not hard at all  Food Insecurity: No Food Insecurity (08/12/2024)   Hunger Vital Sign    Worried About Running  Out of Food in the Last Year: Never true    Ran Out of Food in the Last Year: Never true  Transportation Needs: No Transportation Needs (08/12/2024)   PRAPARE - Administrator, Civil Service (Medical): No    Lack of Transportation (Non-Medical): No  Physical Activity: Insufficiently Active (08/12/2024)   Exercise Vital Sign    Days of Exercise per Week: 1 day    Minutes of Exercise per Session: 30 min  Stress: Stress Concern Present (08/12/2024)   [employer] of Occupational Health - Occupational Stress Questionnaire    Feeling of Stress: Very much  Social Connections: Socially Isolated (08/12/2024)   Social Connection and Isolation Panel    Frequency of  Communication with Friends and Family: Once a week    Frequency of Social Gatherings with Friends and Family: Once a week    Attends Religious Services: Never    Database administrator or Organizations: No    Attends Engineer, structural: Not on file    Marital Status: Married  Catering manager Violence: Not At Risk (10/19/2023)   Humiliation, Afraid, Rape, and Kick questionnaire    Fear of Current or Ex-Partner: No    Emotionally Abused: No    Physically Abused: No    Sexually Abused: No    Family History  Problem Relation Age of Onset   Allergic rhinitis Mother    Hypertension Father     History reviewed. No pertinent surgical history.  ROS: Review of Systems Negative except as stated above  PHYSICAL EXAM: BP 126/84   Pulse 82   Temp 98.3 F (36.8 C) (Oral)   Resp 16   Ht 5' 1 (1.549 m)   Wt 172 lb 12.8 oz (78.4 kg)   SpO2 97%   BMI 32.65 kg/m   Physical Exam HENT:     Head: Normocephalic and atraumatic.     Nose: Nose normal.     Mouth/Throat:     Mouth: Mucous membranes are moist.     Pharynx: Oropharynx is clear.  Eyes:     Extraocular Movements: Extraocular movements intact.     Conjunctiva/sclera: Conjunctivae normal.     Pupils: Pupils are equal, round, and reactive to light.  Cardiovascular:     Rate and Rhythm: Normal rate and regular rhythm.     Pulses: Normal pulses.     Heart sounds: Normal heart sounds.  Pulmonary:     Effort: Pulmonary effort is normal.     Breath sounds: Normal breath sounds.  Musculoskeletal:        General: Normal range of motion.     Cervical back: Normal range of motion and neck supple.  Neurological:     General: No focal deficit present.     Mental Status: He is alert and oriented to person, place, and time.  Psychiatric:        Mood and Affect: Mood normal.        Behavior: Behavior normal.    ASSESSMENT AND PLAN: 1. Encounter for completion of form with patient (Primary) 2. Anxiety and depression -  Patient has form for completion on his cell phone. Patient plans to have form printed for primary provider review and completion. Patient plans to return at later date to pickup completed form.  - Patient declined pharmacological therapy.  - Referral to Psychiatry for evaluation/management.  - Follow-up with primary provider as scheduled.  - Ambulatory referral to Psychiatry   Patient was given the opportunity to  ask questions.  Patient verbalized understanding of the plan and was able to repeat key elements of the plan. Patient was given clear instructions to go to Emergency Department or return to medical center if symptoms don't improve, worsen, or new problems develop.The patient verbalized understanding.   Orders Placed This Encounter  Procedures   Ambulatory referral to Psychiatry    Return for Follow-up as needed.  Greig JINNY Drones, NP

## 2024-08-14 NOTE — Telephone Encounter (Signed)
 A document form has been faxed: Short-term Disability forms, to be filled out by provider. Send document back via Fax within 7-days. Document is located in providers tray at front office.          Fax number: [phone]

## 2024-08-15 ENCOUNTER — Telehealth: Payer: Self-pay | Admitting: Family

## 2024-08-15 NOTE — Telephone Encounter (Signed)
 I gave paperwork to pcp on 08/15/2024

## 2024-08-16 NOTE — Telephone Encounter (Signed)
 I put paperwork up front for patient to pick up.  I spoke with patient yesterday and he is supposed to pick it up today

## 2024-08-16 NOTE — Telephone Encounter (Signed)
 Put paperwork up front for patient to pick up.

## 2024-08-20 ENCOUNTER — Ambulatory Visit: Payer: Self-pay

## 2024-08-20 NOTE — Telephone Encounter (Signed)
 FYI Only or Action Required?: Action required by provider: request for appointment and request for documentation or forms.  Patient was last seen in primary care on 08/14/2024 by Lorren Greig PARAS, NP.  Called Nurse Triage reporting Anxiety.  Symptoms began several weeks ago.  Interventions attempted: Rest, hydration, or home remedies.  Symptoms are: unchanged.  Triage Disposition: See PCP When Office is Open (Within 3 Days)  Patient/caregiver understands and will follow disposition?: No, wishes to speak with PCP        Copied from CRM #[id number]. Topic: Clinical - Red Word Triage >> Aug 20, 2024  2:44 PM Myrick T wrote: Red Word that prompted transfer to Nurse Triage: patient has been having panic attacks and anxiety more often. He has one earlier today Reason for Disposition  MODERATE anxiety (e.g., persistent or frequent anxiety symptoms; interferes with sleep, school, or work)    Economist offered to schedule with alternate provider, pt declines and only wishes to see PCP. Triager will forward encounter for Amy,NP 's office to review and see if pt can be accommodated. Patient verbalized understanding and is expecting call back from office for availabilities.   Additionally, pt has STD/LOA paperwork that he is requesting PCP to fill out that is due by Oct 20.  Answer Assessment - Initial Assessment Questions 1. CONCERN: Did anything happen that prompted you to call today?      I need paperwork that PCP needs to look over again Re-occurring panic attacks. 2. ANXIETY SYMPTOMS: Can you describe how you (your loved one; patient) have been feeling? (e.g., tense, restless, panicky, anxious, keyed up, overwhelmed, sense of impending doom).      *No Answer* 3. ONSET: How long have you been feeling this way? (e.g., hours, days, weeks)     A few minutes 4. SEVERITY: How would you rate the level of anxiety? (e.g., 0 - 10; or mild, moderate, severe).     *No Answer* 5. FUNCTIONAL  IMPAIRMENT: How have these feelings affected your ability to do daily activities? Have you had more difficulty than usual doing your normal daily activities? (e.g., getting better, same, worse; self-care, school, work, interactions)     yes 6. HISTORY: Have you felt this way before? Have you ever been diagnosed with an anxiety problem in the past? (e.g., generalized anxiety disorder, panic attacks, PTSD). If Yes, ask: How was this problem treated? (e.g., medicines, counseling, etc.)     yes 7. RISK OF HARM - SUICIDAL IDEATION: Do you ever have thoughts of hurting or killing yourself? If Yes, ask:  Do you have these feelings now? Do you have a plan on how you would do this?     denies 8. TREATMENT:  What has been done so far to treat this anxiety? (e.g., medicines, relaxation strategies). What has helped?     Relaxation strategies 9. THERAPIST: Do you have a counselor or therapist? If Yes, ask: What is their name?     denies 10. POTENTIAL TRIGGERS: Do you drink caffeinated beverages (e.g., coffee, colas, teas), and how much daily? Do you drink alcohol or use any drugs? Have you started any new medicines recently?       Work related issues trigger. 11. PATIENT SUPPORT: Who is with you now? Who do you live with? Do you have family or friends who you can talk to?        *No Answer* 12. OTHER SYMPTOMS: Do you have any other symptoms? (e.g., feeling depressed, trouble concentrating, trouble sleeping, trouble breathing,  palpitations or fast heartbeat, chest pain, sweating, nausea, or diarrhea)       Shallow breathing, the shakes 13. PREGNANCY: Is there any chance you are pregnant? When was your last menstrual period?       N/a  Protocols used: Anxiety and Panic Attack-A-AH

## 2024-08-20 NOTE — Telephone Encounter (Signed)
 Patient made aware of other options (MU) for OV.  Again, stated he would like to wait for PCP.  Also wanted to f/u with PCP for LOA forms that have been completed but needing corrections.

## 2024-08-27 ENCOUNTER — Telehealth: Payer: Self-pay | Admitting: Family

## 2024-08-27 NOTE — Telephone Encounter (Signed)
 A document form has been faxed: Request form for medical infomation, to be filled out by provider. Send document back via Fax within 7-days to 14 days. Document is located in providers tray at front office.          Fax number: [phone]

## 2024-08-29 ENCOUNTER — Telehealth (INDEPENDENT_AMBULATORY_CARE_PROVIDER_SITE_OTHER): Admitting: Family

## 2024-08-29 DIAGNOSIS — Z0289 Encounter for other administrative examinations: Secondary | ICD-10-CM

## 2024-08-29 DIAGNOSIS — F419 Anxiety disorder, unspecified: Secondary | ICD-10-CM

## 2024-08-29 DIAGNOSIS — F32A Depression, unspecified: Secondary | ICD-10-CM | POA: Diagnosis not present

## 2024-08-29 NOTE — Progress Notes (Signed)
 Virtual Visit via Video Note  I connected with Spencer Wood, on 08/29/2024 at 3:27 PM by video and verified that I am speaking with the correct person using two identifiers.  Consent: I discussed the limitations, risks, security and privacy concerns of performing an evaluation and management service by video and the availability of in person appointments. I also discussed with the patient that there may be a patient responsible charge related to this service. The patient expressed understanding and agreed to proceed.   Location of Patient: Home  Location of Provider: [facility]   Persons participating in Telemedicine visit: [place] Brien Greig Drones, NP Purvis Pepper, CMA   History of Present Illness: Spencer Wood is a 33 y.o. male who presents for Disability And [facility] Provider Statement follow-up.  His issues/concerns today includes: - Patient states his employer is concerned about penmanship on paperwork. States the penmanship written on the entire form does not match the penmanship written on the address.  - Patient states his employer needs office visit notes to supplement Disability And [facility] Provider Statement. Patient states he is unsure of exactly which office visit notes his employer is requesting.  - Patient states primary provider assessment does not match his anxiety depression diagnosis on Disability And [facility] Provider Statement. - On today patient denies thoughts of self-harm, suicidal ideations, homicidal ideations.   No past medical history on file. Allergies  Allergen Reactions   Cephalexin     Current Outpatient Medications on File Prior to Visit  Medication Sig Dispense Refill   albuterol  (VENTOLIN  HFA) 108 (90 Base) MCG/ACT inhaler Inhale 1-2 puffs into the lungs every 6 (six) hours as needed for wheezing or shortness of breath. 18 g 2   Azelastine -Fluticasone  137-50 MCG/ACT  SUSP Place 1 spray into the nose daily as needed. 23 g 2   benzonatate  (TESSALON  PERLES) 100 MG capsule Take 1 capsule (100 mg total) by mouth 3 (three) times daily as needed for cough. 30 capsule 2   brompheniramine-pseudoephedrine-DM 30-2-10 MG/5ML syrup Take 5 mLs by mouth 4 (four) times daily as needed. 120 mL 0   fexofenadine  (ALLEGRA ) 180 MG tablet Take 180 mg by mouth daily.     fexofenadine  (ALLEGRA ) 180 MG tablet Take 1 tablet (180 mg total) by mouth daily. 90 tablet 0   NONFORMULARY OR COMPOUNDED ITEM Take 1 Dose by mouth daily as needed. Raw Green Powder     Olopatadine  HCl (PATADAY ) 0.2 % SOLN Place 1 drop into both eyes daily. (Patient not taking: Reported on 02/28/2024) 2.5 mL 2   OVER THE COUNTER MEDICATION Take 1 Dose by mouth daily. Eco Drink     No current facility-administered medications on file prior to visit.    Observations/Objective: Alert and oriented x 3. Not in acute distress. Physical examination not completed as this is a telemedicine visit.  Assessment and Plan: 1. Encounter for completion of form with patient (Primary) 2. Anxiety and depression - Patient denies thoughts of self-harm, suicidal ideations, homicidal ideations. - Continue present management. - I discussed with patient I will need to consult with my supervising physician Raguel Blush, MD for recommendations on next steps. Patient verbalized understanding/agreement.  - I discussed with patient in detail the handwriting for the office address was completed by the Certified Medical Assistant and usually allowed. However, I did completed the remaining portions of the form and handwriting does match with my signature. Patient verbalized understanding/agreement.   Follow  Up Instructions: Follow-up with primary provider as scheduled.   Patient was given clear instructions to go to Emergency Department or return to medical center if symptoms don't improve, worsen, or new problems develop.The patient  verbalized understanding.  I discussed the assessment and treatment plan with the patient. The patient was provided an opportunity to ask questions and all were answered. The patient agreed with the plan and demonstrated an understanding of the instructions.   The patient was advised to call back or seek an in-person evaluation if the symptoms worsen or if the condition fails to improve as anticipated.     I provided 8 minutes total of non-face-to-face time during this encounter.   Greig JINNY Drones, NP  [facility] at [facility] [address], [place] [phone] 08/29/2024, 3:27 PM

## 2024-08-30 ENCOUNTER — Telehealth: Payer: Self-pay | Admitting: Family

## 2024-08-30 ENCOUNTER — Encounter: Payer: Self-pay | Admitting: Emergency Medicine

## 2024-08-30 NOTE — Telephone Encounter (Signed)
 Sent information requested to my chart per patient

## 2024-08-30 NOTE — Telephone Encounter (Signed)
 Copied from CRM #[id number]. Topic: General - Other >> Aug 30, 2024  1:12 PM Shanda MATSU wrote: Reason for CRM: Patient is req that info placed on today be placed in his MyChart as well: Call patient with update. Disability And [facility] Provider Statement  form reviewed with supervising physician Raguel Blush, MD. Due to patient appears medically stable no revisions to current form recommended. Recommendation for patient to continue with plan discussed in office on 08/14/2024 (referral to Psychiatry) for further evaluation/management and revisions to form if appropriate.

## 2024-08-30 NOTE — Telephone Encounter (Signed)
 Patient has not received a call for referral  and wants to know if you can update form or write  him out of work until he goes to his referral.

## 2024-08-30 NOTE — Telephone Encounter (Signed)
 Copied from CRM [id number]. Topic: Complaint (DO NOT CONVERT) - Care >> Aug 30, 2024  1:22 PM Darshell M wrote: Date of Incident: 08/14/2024 Details of complaint: Patient saw Greig Drones, Wife, Dellar, was present for visit. Patient and wife are concerned patient isn't being taken seriously. They believe that  notes were not an accurate account of the visit. Per patient and spouse, the note does not mention what actually happened in the visit and they are concerned that either is being overlooked or the notes has been falsified. Patient indicating anxiety and depression. Per patient note states mood is fine., but that is not true. How would the patient like to see it resolved? Patient has altered mental state and that has not been addressed. Mood, thought process is not accurately reflected in the note. This is causing more anxiety and panic attacks. Spouse would like to speak with someone who can successfully resolve. On a scale of 1-10, how was your experience? Amy Drones has been a great but they feel that because he did not indicate suicidal thoughts, she did not take him seriously.  What would it take to bring it to a 10? Accurately evaluate and assist the patient. Patient also noticed that hand writing on disability forms was not the providers handwriting. The provider advised them that RN completed paperwork. Patient and spouse noted negative interactions in the past.   Patient also requesting referral to psychiatry.  Patient call back # [phone]. Route to Research officer, political party.

## 2024-08-31 NOTE — Telephone Encounter (Signed)
 Will continue with recommendation from supervising physician Raguel Blush, MD. Also, please note Disability And [facility] Provider Statement completed on 08/15/24  1:01 PM.

## 2024-09-04 NOTE — Telephone Encounter (Signed)
 Pcp can not fiill out form has to go to referral to get it filled out

## 2024-09-07 ENCOUNTER — Telehealth: Payer: Self-pay | Admitting: Family

## 2024-09-07 NOTE — Telephone Encounter (Signed)
 Noted

## 2024-09-07 NOTE — Telephone Encounter (Signed)
 Copied from CRM #[id number]. Topic: Referral - Request for Referral >> Sep 07, 2024  8:32 AM Avram MATSU wrote: Did the patient discuss referral with their provider in the last year? Yes (If No - schedule appointment) (If Yes - send message)  Appointment offered? Yes  Type of order/referral and detailed reason for visit: psychiatry, in pt appt notes for 9/30 it stated a referral was place but I do not see it in the chart. Please call patient if theres a status update for previous request. [phone]  Preference of office, provider, location: [place] or [facility]  If referral order, have you been seen by this specialty before? No (If Yes, this issue or another issue? When? Where?  Can we respond through MyChart? Yes

## 2024-09-10 NOTE — Telephone Encounter (Signed)
 [facility] ph# [phone] Fax # [phone]    Patient states he has scheduled an appointment with [facility]. He will reach back out to the office if he needs an appointment otherwise.

## 2024-09-11 ENCOUNTER — Telehealth: Payer: Self-pay | Admitting: Emergency Medicine

## 2024-09-11 NOTE — Telephone Encounter (Signed)
 Copied from CRM #[id number]. Topic: Referral - Status >> Sep 05, 2024  9:24 AM Vena HERO wrote: Reason for CRM: Pt called in to request provider info for referral for psychiatrist. He has not heard anything yet and would someone to either call or message via mychart when this info is available.  [phone] patient [phone]  Nurse talk to patient yesterday and she stated he received a call

## 2024-09-13 DIAGNOSIS — F411 Generalized anxiety disorder: Secondary | ICD-10-CM | POA: Diagnosis not present

## 2024-09-14 ENCOUNTER — Telehealth: Payer: Self-pay

## 2024-09-14 ENCOUNTER — Telehealth: Payer: Self-pay | Admitting: Family

## 2024-09-14 NOTE — Telephone Encounter (Signed)
 Copied from CRM #[id number]. Topic: General - Other >> Sep 14, 2024 12:08 PM Delon DASEN wrote: Reason for CRM: Marissa with [employer], need further information for his short term disability claim- [id number]

## 2024-09-14 NOTE — Telephone Encounter (Signed)
 Will continue with recommendations from supervising physician Dr. Raguel Blush presently.

## 2024-09-14 NOTE — Telephone Encounter (Signed)
 A document form has been faxed: Clarification needed for short term disability, to be filled out by provider. Send document back via Fax within 7-days. Document is located in providers tray at front office.          Fax number: [phone]

## 2024-09-17 ENCOUNTER — Encounter: Payer: Self-pay | Admitting: Family

## 2024-09-17 ENCOUNTER — Ambulatory Visit (INDEPENDENT_AMBULATORY_CARE_PROVIDER_SITE_OTHER): Admitting: Family

## 2024-09-17 VITALS — BP 123/81 | HR 68 | Temp 98.4°F | Resp 16 | Ht 61.0 in | Wt 177.6 lb

## 2024-09-17 DIAGNOSIS — Z13220 Encounter for screening for lipoid disorders: Secondary | ICD-10-CM

## 2024-09-17 DIAGNOSIS — Z130 Encounter for screening for diseases of the blood and blood-forming organs and certain disorders involving the immune mechanism: Secondary | ICD-10-CM

## 2024-09-17 DIAGNOSIS — Z0000 Encounter for general adult medical examination without abnormal findings: Secondary | ICD-10-CM | POA: Diagnosis not present

## 2024-09-17 DIAGNOSIS — Z13228 Encounter for screening for other metabolic disorders: Secondary | ICD-10-CM | POA: Diagnosis not present

## 2024-09-17 DIAGNOSIS — Z1329 Encounter for screening for other suspected endocrine disorder: Secondary | ICD-10-CM

## 2024-09-17 DIAGNOSIS — Z131 Encounter for screening for diabetes mellitus: Secondary | ICD-10-CM

## 2024-09-17 NOTE — Telephone Encounter (Signed)
 Spencer Wood and I called today and spoken  with representative about the forms

## 2024-09-17 NOTE — Progress Notes (Signed)
 Patient scored a 12 on PHQ-9, Patient scored 12 on GAD-7

## 2024-09-17 NOTE — Progress Notes (Signed)
 Patient ID: Spencer Wood, male    DOB: 1991-04-10  MRN: [id number]  CC: Annual Exam  Subjective: Spencer Wood is a 33 y.o. male who presents for annual exam.   His concerns today include:  - On today my supervisor Olam Danker reported to me since patient's last office visit patient has established with Psychiatry (with several upcoming appointments) for continued management of anxiety depression. Patient denies thoughts of self-harm, suicidal ideations, homicidal ideations.     09/17/2024    2:09 PM 08/14/2024    9:59 AM 04/18/2024    8:52 AM 02/28/2024    3:39 PM 10/19/2023    9:08 AM  Depression screen PHQ 2/9  Decreased Interest 2 1 0 0 0  Down, Depressed, Hopeless 2 3 0 0 0  PHQ - 2 Score 4 4 0 0 0  Altered sleeping 2 1     Tired, decreased energy 2 3     Change in appetite 1 0     Feeling bad or failure about yourself  1 1     Trouble concentrating 2 3     Moving slowly or fidgety/restless 0 0     Suicidal thoughts 0 0     PHQ-9 Score 12 12     Difficult doing work/chores Very difficult Very difficult       Patient Active Problem List   Diagnosis Date Noted   Low back pain 04/07/2022   Seasonal and perennial allergic rhinitis 12/07/2021   Dyspnea on exertion 12/07/2021     Current Outpatient Medications on File Prior to Visit  Medication Sig Dispense Refill   benzonatate  (TESSALON  PERLES) 100 MG capsule Take 1 capsule (100 mg total) by mouth 3 (three) times daily as needed for cough. 30 capsule 2   brompheniramine-pseudoephedrine-DM 30-2-10 MG/5ML syrup Take 5 mLs by mouth 4 (four) times daily as needed. 120 mL 0   fexofenadine  (ALLEGRA ) 180 MG tablet Take 180 mg by mouth daily. (Patient taking differently: Take 180 mg by mouth daily. As needed)     sertraline (ZOLOFT) 25 MG tablet      albuterol  (VENTOLIN  HFA) 108 (90 Base) MCG/ACT inhaler Inhale 1-2 puffs into the lungs every 6 (six) hours as needed for wheezing or shortness of breath. 18 g 2    Azelastine -Fluticasone  137-50 MCG/ACT SUSP Place 1 spray into the nose daily as needed. 23 g 2   fexofenadine  (ALLEGRA ) 180 MG tablet Take 1 tablet (180 mg total) by mouth daily. 90 tablet 0   NONFORMULARY OR COMPOUNDED ITEM Take 1 Dose by mouth daily as needed. Raw Green Powder     Olopatadine  HCl (PATADAY ) 0.2 % SOLN Place 1 drop into both eyes daily. (Patient not taking: Reported on 02/28/2024) 2.5 mL 2   OVER THE COUNTER MEDICATION Take 1 Dose by mouth daily. Eco Drink     No current facility-administered medications on file prior to visit.    Allergies  Allergen Reactions   Cephalexin     Social History   Socioeconomic History   Marital status: Married    Spouse name: Not on file   Number of children: Not on file   Years of education: Not on file   Highest education level: Bachelor's degree (e.g., BA, AB, BS)  Occupational History   Not on file  Tobacco Use   Smoking status: Never    Passive exposure: Never   Smokeless tobacco: Never  Vaping Use   Vaping status: Never Used  Substance and Sexual  Activity   Alcohol use: Yes    Alcohol/week: 1.0 standard drink of alcohol    Types: 1 Standard drinks or equivalent per week    Comment: Periodically   Drug use: Never   Sexual activity: Yes  Other Topics Concern   Not on file  Social History Narrative   Not on file   Social Drivers of Health   Financial Resource Strain: Low Risk  (08/25/2024)   Overall Financial Resource Strain (CARDIA)    Difficulty of Paying Living Expenses: Not hard at all  Food Insecurity: No Food Insecurity (08/25/2024)   Hunger Vital Sign    Worried About Running Out of Food in the Last Year: Never true    Ran Out of Food in the Last Year: Never true  Transportation Needs: No Transportation Needs (08/25/2024)   PRAPARE - Administrator, Civil Service (Medical): No    Lack of Transportation (Non-Medical): No  Physical Activity: Inactive (08/25/2024)   Exercise Vital Sign    Days of  Exercise per Week: 0 days    Minutes of Exercise per Session: Not on file  Stress: Stress Concern Present (08/25/2024)   [employer] of Occupational Health - Occupational Stress Questionnaire    Feeling of Stress: Rather much  Social Connections: Socially Isolated (08/25/2024)   Social Connection and Isolation Panel    Frequency of Communication with Friends and Family: Once a week    Frequency of Social Gatherings with Friends and Family: Never    Attends Religious Services: Never    Database Administrator or Organizations: No    Attends Engineer, Structural: Not on file    Marital Status: Married  Catering Manager Violence: Not At Risk (10/19/2023)   Humiliation, Afraid, Rape, and Kick questionnaire    Fear of Current or Ex-Partner: No    Emotionally Abused: No    Physically Abused: No    Sexually Abused: No    Family History  Problem Relation Age of Onset   Allergic rhinitis Mother    Hypertension Father     History reviewed. No pertinent surgical history.  ROS: Review of Systems Negative except as stated above  PHYSICAL EXAM: BP 123/81   Pulse 68   Temp 98.4 F (36.9 C) (Oral)   Resp 16   Ht 5' 1 (1.549 m)   Wt 177 lb 9.6 oz (80.6 kg)   SpO2 97%   BMI 33.56 kg/m   Physical Exam HENT:     Head: Normocephalic and atraumatic.     Right Ear: Tympanic membrane, ear canal and external ear normal.     Left Ear: Tympanic membrane, ear canal and external ear normal.     Nose: Nose normal.     Mouth/Throat:     Mouth: Mucous membranes are moist.     Pharynx: Oropharynx is clear.  Eyes:     Extraocular Movements: Extraocular movements intact.     Conjunctiva/sclera: Conjunctivae normal.     Pupils: Pupils are equal, round, and reactive to light.  Neck:     Thyroid : No thyroid  mass, thyromegaly or thyroid  tenderness.  Cardiovascular:     Rate and Rhythm: Normal rate and regular rhythm.     Pulses: Normal pulses.     Heart sounds: Normal heart  sounds.  Pulmonary:     Effort: Pulmonary effort is normal.     Breath sounds: Normal breath sounds.  Abdominal:     General: Bowel sounds are normal.  Palpations: Abdomen is soft.  Genitourinary:    Comments: Patient declined. Musculoskeletal:        General: Normal range of motion.     Right shoulder: Normal.     Left shoulder: Normal.     Right upper arm: Normal.     Left upper arm: Normal.     Right elbow: Normal.     Left elbow: Normal.     Right forearm: Normal.     Left forearm: Normal.     Right wrist: Normal.     Left wrist: Normal.     Right hand: Normal.     Left hand: Normal.     Cervical back: Normal, normal range of motion and neck supple.     Thoracic back: Normal.     Lumbar back: Normal.     Right hip: Normal.     Left hip: Normal.     Right upper leg: Normal.     Left upper leg: Normal.     Right knee: Normal.     Left knee: Normal.     Right lower leg: Normal.     Left lower leg: Normal.     Right ankle: Normal.     Left ankle: Normal.     Right foot: Normal.     Left foot: Normal.  Skin:    General: Skin is warm and dry.     Capillary Refill: Capillary refill takes less than 2 seconds.  Neurological:     General: No focal deficit present.     Mental Status: He is alert and oriented to person, place, and time.  Psychiatric:        Mood and Affect: Mood normal.        Behavior: Behavior normal.    ASSESSMENT AND PLAN: 1. Annual physical exam (Primary) - Counseled on 150 minutes of exercise per week as tolerated, healthy eating (including decreased daily intake of saturated fats, cholesterol, added sugars, sodium), STI prevention, and routine healthcare maintenance.  2. Screening for metabolic disorder - Routine screening.  - CMP14+EGFR  3. Screening for deficiency anemia - Routine screening.  - CBC  4. Diabetes mellitus screening - Routine screening.  - Hemoglobin A1c  5. Screening cholesterol level - Routine screening.  - Lipid  panel  6. Thyroid  disorder screen - Routine screening.  - TSH   Patient was given the opportunity to ask questions.  Patient verbalized understanding of the plan and was able to repeat key elements of the plan. Patient was given clear instructions to go to Emergency Department or return to medical center if symptoms don't improve, worsen, or new problems develop.The patient verbalized understanding.   Orders Placed This Encounter  Procedures   CBC   Lipid panel   CMP14+EGFR   Hemoglobin A1c   TSH     Return in about 1 year (around 09/17/2025) for Physical per patient preference.  Greig JINNY Drones, NP

## 2024-09-18 ENCOUNTER — Ambulatory Visit: Payer: Self-pay | Admitting: Family

## 2024-09-18 DIAGNOSIS — E785 Hyperlipidemia, unspecified: Secondary | ICD-10-CM

## 2024-09-18 DIAGNOSIS — R748 Abnormal levels of other serum enzymes: Secondary | ICD-10-CM

## 2024-09-18 LAB — CMP14+EGFR
ALT: 75 IU/L — ABNORMAL HIGH (ref 0–44)
AST: 31 IU/L (ref 0–40)
Albumin: 4.6 g/dL (ref 4.1–5.1)
Alkaline Phosphatase: 63 IU/L (ref 47–123)
BUN/Creatinine Ratio: 9 (ref 9–20)
BUN: 9 mg/dL (ref 6–20)
Bilirubin Total: 0.4 mg/dL (ref 0.0–1.2)
CO2: 24 mmol/L (ref 20–29)
Calcium: 9.2 mg/dL (ref 8.7–10.2)
Chloride: 102 mmol/L (ref 96–106)
Creatinine, Ser: 1.04 mg/dL (ref 0.76–1.27)
Globulin, Total: 3 g/dL (ref 1.5–4.5)
Glucose: 90 mg/dL (ref 70–99)
Potassium: 4.9 mmol/L (ref 3.5–5.2)
Sodium: 141 mmol/L (ref 134–144)
Total Protein: 7.6 g/dL (ref 6.0–8.5)
eGFR: 98 mL/min/1.73 (ref >59)

## 2024-09-18 LAB — LIPID PANEL
Chol/HDL Ratio: 4 ratio (ref 0.0–5.0)
Cholesterol, Total: 232 mg/dL — ABNORMAL HIGH (ref 100–199)
HDL: 58 mg/dL (ref >39)
LDL Chol Calc (NIH): 154 mg/dL — ABNORMAL HIGH (ref 0–99)
Triglycerides: 111 mg/dL (ref 0–149)
VLDL Cholesterol Cal: 20 mg/dL (ref 5–40)

## 2024-09-18 LAB — CBC
Hematocrit: 44.1 % (ref 37.5–51.0)
Hemoglobin: 14.4 g/dL (ref 13.0–17.7)
MCH: 31.4 pg (ref 26.6–33.0)
MCHC: 32.7 g/dL (ref 31.5–35.7)
MCV: 96 fL (ref 79–97)
Platelets: 338 x10E3/uL (ref 150–450)
RBC: 4.58 x10E6/uL (ref 4.14–5.80)
RDW: 11.2 % — ABNORMAL LOW (ref 11.6–15.4)
WBC: 4.7 x10E3/uL (ref 3.4–10.8)

## 2024-09-18 LAB — TSH: TSH: 1.67 u[IU]/mL (ref 0.450–4.500)

## 2024-09-18 LAB — HEMOGLOBIN A1C
Est. average glucose Bld gHb Est-mCnc: 82 mg/dL
Hgb A1c MFr Bld: 4.5 % — ABNORMAL LOW (ref 4.8–5.6)

## 2024-09-18 MED ORDER — ATORVASTATIN CALCIUM 20 MG PO TABS: 20.0000 mg | ORAL_TABLET | Freq: Every day | ORAL | 0 refills | Status: AC

## 2024-09-21 ENCOUNTER — Ambulatory Visit: Admitting: Family

## 2024-09-26 DIAGNOSIS — F411 Generalized anxiety disorder: Secondary | ICD-10-CM | POA: Diagnosis not present

## 2024-10-03 DIAGNOSIS — F411 Generalized anxiety disorder: Secondary | ICD-10-CM | POA: Diagnosis not present

## 2024-10-17 ENCOUNTER — Other Ambulatory Visit: Payer: Self-pay

## 2024-10-17 ENCOUNTER — Telehealth: Payer: Self-pay

## 2024-10-17 DIAGNOSIS — F411 Generalized anxiety disorder: Secondary | ICD-10-CM | POA: Diagnosis not present

## 2024-10-17 NOTE — Telephone Encounter (Signed)
 Per pt need cacnel until he get insurance coverage

## 2024-10-19 ENCOUNTER — Ambulatory Visit: Admitting: Family Medicine

## 2024-11-30 ENCOUNTER — Ambulatory Visit: Admitting: Family Medicine

## 2024-12-14 ENCOUNTER — Encounter: Admitting: Family

## 2025-09-18 ENCOUNTER — Encounter: Admitting: Family
